# Patient Record
Sex: Male | Born: 1985 | Race: White | Hispanic: No | Marital: Single | Smoking: Former smoker
Health system: Southern US, Community
[De-identification: ages and names within clinical notes are randomized; demographics above are authoritative.]

## PROBLEM LIST (undated history)

## (undated) DIAGNOSIS — K219 Gastro-esophageal reflux disease without esophagitis: Secondary | ICD-10-CM

## (undated) DIAGNOSIS — J309 Allergic rhinitis, unspecified: Secondary | ICD-10-CM

## (undated) HISTORY — DX: Allergic rhinitis, unspecified: J30.9

## (undated) HISTORY — PX: OTHER SURGICAL HISTORY: SHX169

## (undated) HISTORY — PX: WISDOM TOOTH EXTRACTION: SHX21

## (undated) HISTORY — DX: Gastro-esophageal reflux disease without esophagitis: K21.9

---
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
## 2012-06-02 ENCOUNTER — Emergency Department (HOSPITAL_COMMUNITY)
Admission: EM | Admit: 2012-06-02 | Discharge: 2012-06-02 | Disposition: A | Discharge status: Home / Self Care | Payer: BC Managed Care – PPO | Attending: Emergency Medicine | Admitting: Emergency Medicine

## 2012-06-02 ENCOUNTER — Encounter (HOSPITAL_COMMUNITY): Payer: Self-pay | Admitting: Adult Health

## 2012-06-02 DIAGNOSIS — K089 Disorder of teeth and supporting structures, unspecified: Secondary | ICD-10-CM | POA: Insufficient documentation

## 2012-06-02 DIAGNOSIS — K0889 Other specified disorders of teeth and supporting structures: Secondary | ICD-10-CM

## 2012-06-02 DIAGNOSIS — Z791 Long term (current) use of non-steroidal anti-inflammatories (NSAID): Secondary | ICD-10-CM | POA: Insufficient documentation

## 2012-06-02 MED ORDER — CLINDAMYCIN HCL 150 MG PO CAPS: 150.0000 mg | ORAL_CAPSULE | Freq: Four times a day (QID) | ORAL | Status: DC

## 2012-06-02 MED ORDER — HYDROCODONE-ACETAMINOPHEN 5-325 MG PO TABS: 1.0000 | ORAL_TABLET | Freq: Four times a day (QID) | ORAL | Status: DC | PRN

## 2012-06-02 MED ORDER — OXYCODONE-ACETAMINOPHEN 5-325 MG PO TABS
2.0000 | ORAL_TABLET | Freq: Once | ORAL | Status: AC
  Administered 2012-06-02: 2 via ORAL
  Filled 2012-06-02: qty 2

## 2012-06-02 NOTE — ED Notes (Signed)
Presents with upper right molar toothache since 715 this evening. Recently had filling done in November.

## 2012-06-02 NOTE — ED Notes (Signed)
Has taken aleve for pain w/o relief.

## 2012-06-03 NOTE — ED Provider Notes (Signed)
Medical screening examination/treatment/procedure(s) were performed by non-physician practitioner and as supervising physician I was immediately available for consultation/collaboration.  Chrysa Rampy M Breeann Reposa, MD 06/03/12 0642 

## 2012-06-03 NOTE — ED Provider Notes (Signed)
History     CSN: [id number]  Arrival date & time 06/02/12  2054   First MD Initiated Contact with Patient 06/02/12 2148      Chief Complaint  Patient presents with  . Dental Pain    (Consider location/radiation/quality/duration/timing/severity/associated sxs/prior treatment) HPI Comments: This is a 26 year old male, who presents emergency department with chief complaint of right molar pain. Patient states that his pain is 10 out of 10. He has not taken anything to alleviate his symptoms. Pain began this evening at 7:15. The symptoms are worsening. She denies trauma or mechanism of injury. Patient denies headache, blurred vision, new hearing loss, sore throat, chest pain, shortness of breath, nausea, vomiting, diarrhea, constipation, dysuria, peripheral edema, back pain, numbness or tingling of the extremities.   The history is provided by the patient. No language interpreter was used.    History reviewed. No pertinent past medical history.  No past surgical history on file.  No family history on file.  History  Substance Use Topics  . Smoking status: Not on file  . Smokeless tobacco: Not on file  . Alcohol Use: Not on file      Review of Systems  All other systems reviewed and are negative.    Allergies  Amoxicillin and Penicillins  Home Medications   Current Outpatient Rx  Name  Route  Sig  Dispense  Refill  . NAPROXEN SODIUM 220 MG PO TABS   Oral   Take 220 mg by mouth 2 (two) times daily with a meal.         . CLINDAMYCIN HCL 150 MG PO CAPS   Oral   Take 1 capsule (150 mg total) by mouth every 6 (six) hours.   28 capsule   0   . HYDROCODONE-ACETAMINOPHEN 5-325 MG PO TABS   Oral   Take 1 tablet by mouth every 6 (six) hours as needed for pain.   15 tablet   0     BP 142/81  Pulse 91  Temp 98.2 F (36.8 C) (Oral)  Resp 14  SpO2 100%  Physical Exam  Nursing note and vitals reviewed. Constitutional: He is oriented to person, place, and time.  He appears well-developed and well-nourished.  HENT:  Head: Normocephalic and atraumatic.  Mouth/Throat: No oropharyngeal exudate.         Oropharynx is clear, uvula is midline, no tonsillar or peritonsillar abscess, no exudate seen.  Eyes: Conjunctivae normal and EOM are normal.  Neck: Normal range of motion.  Cardiovascular: Normal rate.   Pulmonary/Chest: Effort normal.  Abdominal: He exhibits no distension.  Musculoskeletal: Normal range of motion.  Neurological: He is alert and oriented to person, place, and time.  Skin: Skin is dry.  Psychiatric: He has a normal mood and affect. His behavior is normal. Judgment and thought content normal.    ED Course  Procedures (including critical care time)  Labs Reviewed - No data to display No results found.   1. Pain, dental       MDM  26 year old male with dental pain. I will treat the patient with clindamycin and give him pain medicine. Patient encouraged to followup with his dentist. He understands and agrees with the plan. He is stable and ready for discharge.        Roxy Horseman, PA-C 06/03/12 [phone]

## 2012-12-31 ENCOUNTER — Other Ambulatory Visit: Payer: Self-pay | Admitting: Nurse Practitioner

## 2013-01-02 NOTE — Telephone Encounter (Signed)
Patient last seen by [facility] in office on 03-03-12. Please advise

## 2015-12-26 DIAGNOSIS — L237 Allergic contact dermatitis due to plants, except food: Secondary | ICD-10-CM | POA: Diagnosis not present

## 2016-03-19 DIAGNOSIS — J020 Streptococcal pharyngitis: Secondary | ICD-10-CM | POA: Diagnosis not present

## 2016-03-19 DIAGNOSIS — J029 Acute pharyngitis, unspecified: Secondary | ICD-10-CM | POA: Diagnosis not present

## 2016-07-24 DIAGNOSIS — R52 Pain, unspecified: Secondary | ICD-10-CM | POA: Diagnosis not present

## 2016-07-24 DIAGNOSIS — J111 Influenza due to unidentified influenza virus with other respiratory manifestations: Secondary | ICD-10-CM | POA: Diagnosis not present

## 2017-01-27 ENCOUNTER — Encounter: Payer: Self-pay | Admitting: Family Medicine

## 2017-01-27 ENCOUNTER — Ambulatory Visit (INDEPENDENT_AMBULATORY_CARE_PROVIDER_SITE_OTHER): Payer: BLUE CROSS/BLUE SHIELD | Admitting: Family Medicine

## 2017-01-27 VITALS — BP 148/90 | HR 114 | Temp 97.2°F | Ht 69.0 in | Wt 169.4 lb

## 2017-01-27 DIAGNOSIS — R238 Other skin changes: Secondary | ICD-10-CM | POA: Diagnosis not present

## 2017-01-27 DIAGNOSIS — L989 Disorder of the skin and subcutaneous tissue, unspecified: Secondary | ICD-10-CM | POA: Diagnosis not present

## 2017-01-27 DIAGNOSIS — Z113 Encounter for screening for infections with a predominantly sexual mode of transmission: Secondary | ICD-10-CM | POA: Diagnosis not present

## 2017-01-27 NOTE — Patient Instructions (Addendum)
Great to meet you!  We will call with results or send you a message on Mychart within 1 week

## 2017-01-27 NOTE — Progress Notes (Signed)
   HPI  Patient presents today here to establish care  Bump in the groin Present ffor 2 months, no changing or irritation, has not resolved  Has had 3 partners, all male, in 6 months, does use condoms.  No testicular pain or penile dc  Skin lesion Present "forever" No changes, no irritation or bleeding.    PMH: None Past surgical history: Wisdom tooth extraction Family history MG unit and PGM with Alzheimer's disease Social history: Former smoker, moderate to heavy alcohol use, no drug use  ROS: Per HPI  Objective: BP (!) 148/90   Pulse (!) 114   Temp (!) 97.2 F (36.2 C) (Oral)   Ht 5\' 9"  (1.753 m)   Wt 169 lb 6.4 oz (76.8 kg)   BMI 25.02 kg/m  Gen: NAD, alert, cooperative with exam HEENT: NCAT CV: RRR, good S1/S2, no murmur Resp: CTABL, no wheezes, non-labored Ext: No edema, warm Neuro: Alert and oriented, No gross deficits Skin:  At 11:00 of the penis, approximately 3 cm above he penis he has a very small , approximately 2 mm x 2 mm erythematous slightly raised lesion without any characteristic ( verucous or vesicular) findings  Mid left thoracic back with 5 mm x 4 mm fleshy skin lesion that is flesh-colored with telangiectasia  Assessment and plan:  # Papule, screening for STI Labs for screening for STI's today, the lesion in his groin is not consistent with any STI Reassurance provided Continue to practice safe sex Reassurance, follow-up as needed  # Skin lesion Left upper back skin lesion consistent with skin tag, reassurance provided Follow with time, return to clinic if change in or irritated   Orders Placed This Encounter  Procedures  . HIV antibody  . RPR  . HSV 2 antibody, IgG     Murtis SinkSam Jazzalyn Loewenstein, MD [facility] 01/27/2017, 1:46 PM

## 2017-01-29 LAB — HSV-2 IGG SUPPLEMENTAL TEST: HSV-2 IGG SUPPLEMENTAL TEST: POSITIVE — AB

## 2017-01-29 LAB — HIV ANTIBODY (ROUTINE TESTING W REFLEX): HIV SCREEN 4TH GENERATION: NONREACTIVE

## 2017-01-29 LAB — HSV 2 ANTIBODY, IGG: HSV 2 IGG, TYPE SPEC: 4.51 {index} — AB (ref 0.00–0.90)

## 2017-01-29 LAB — RPR: RPR Ser Ql: NONREACTIVE

## 2017-02-01 ENCOUNTER — Telehealth: Payer: Self-pay | Admitting: Family Medicine

## 2017-02-01 NOTE — Telephone Encounter (Signed)
Called and discussed negative HIV and RPR. HSV2 pending.   Murtis Sink, MD [facility] 02/01/2017, 7:52 AM

## 2017-02-04 ENCOUNTER — Telehealth: Payer: Self-pay | Admitting: Family Medicine

## 2017-02-04 NOTE — Telephone Encounter (Signed)
Called and discussed + HSV 2 titers. His lesion has healed.  The lesion was not c/w HSV, however we discussed cautions around HSV and come in for culture/swab if vesicles come up.   Murtis Sink, MD [facility] 02/04/2017, 7:57 AM

## 2017-03-10 ENCOUNTER — Ambulatory Visit: Payer: BLUE CROSS/BLUE SHIELD | Admitting: Family Medicine

## 2017-03-16 ENCOUNTER — Ambulatory Visit (INDEPENDENT_AMBULATORY_CARE_PROVIDER_SITE_OTHER): Payer: BLUE CROSS/BLUE SHIELD | Admitting: Family Medicine

## 2017-03-16 ENCOUNTER — Encounter: Payer: Self-pay | Admitting: Family Medicine

## 2017-03-16 VITALS — BP 138/88 | HR 104 | Temp 97.3°F | Ht 69.0 in | Wt 167.6 lb

## 2017-03-16 DIAGNOSIS — R300 Dysuria: Secondary | ICD-10-CM | POA: Diagnosis not present

## 2017-03-16 LAB — MICROSCOPIC EXAMINATION: RENAL EPITHEL UA: NONE SEEN /HPF

## 2017-03-16 LAB — URINALYSIS, COMPLETE
Bilirubin, UA: NEGATIVE
GLUCOSE, UA: NEGATIVE
Ketones, UA: NEGATIVE
Nitrite, UA: NEGATIVE
Protein, UA: NEGATIVE
RBC UA: NEGATIVE
Specific Gravity, UA: 1.02 (ref 1.005–1.030)
Urobilinogen, Ur: 0.2 mg/dL (ref 0.2–1.0)
pH, UA: 6 (ref 5.0–7.5)

## 2017-03-16 NOTE — Progress Notes (Signed)
   HPI  Patient presents today here with dysuria.  Patient states that he's had symptoms for about 2 or 3 weeks. He complains of dribbling after urination, urethral irritation after urination, and urinating frequently.  Patient states that he's had sharp increase in his sexual activity, over the last 6 weeks he's had sex one to 2 times daily with a new partner. He began having symptoms about 2 or 3 weeks after he began seeing her.  He states that he did have one episode of malaise and fatigue last week that lasted about 4 hours.  PMH: Smoking status noted ROS: Per HPI  Objective: BP 138/88   Pulse (!) 104   Temp (!) 97.3 F (36.3 C) (Oral)   Ht  (1.753 m)   Wt 167 lb 9.6 oz (76 kg)   BMI 24.75 kg/m  Gen: NAD, alert, cooperative with exam HEENT: NCAT CV: RRR, good S1/S2, no murmur Resp: CTABL, no wheezes, non-labored Abd: SNTND, BS present, no guarding or organomegaly, no CVA tenderness, no suprapubic tenderness Ext: No edema, warm Neuro: Alert and oriented, No gross deficits  Assessment and plan:  # dysuria Reassurance provided, likely this is due to prostatic irritation Discussed prostate exam today, he would like to defer. Urinalysis and GCC collected, discussed further testing if these are positive.     Orders Placed This Encounter  Procedures  . GC/Chlamydia Probe Amp  . Urine Culture  . Urinalysis, Complete    Murtis Sink, MD [facility] 03/16/2017, 5:11 PM

## 2017-03-16 NOTE — Patient Instructions (Signed)
Great to see you!  Call or come back with any concerns  We will let you know about your test results

## 2017-03-17 LAB — URINE CULTURE: Organism ID, Bacteria: NO GROWTH

## 2017-03-19 LAB — GC/CHLAMYDIA PROBE AMP
Chlamydia trachomatis, NAA: POSITIVE — AB
Neisseria gonorrhoeae by PCR: NEGATIVE

## 2017-03-21 ENCOUNTER — Telehealth: Payer: Self-pay | Admitting: Family Medicine

## 2017-03-21 MED ORDER — AZITHROMYCIN 250 MG PO TABS: 1000.0000 mg | ORAL_TABLET | Freq: Once | ORAL | 0 refills | Status: AC

## 2017-03-21 NOTE — Telephone Encounter (Signed)
Spoke with patient and he would like to speak to you directly. Do you mind calling him?

## 2017-03-21 NOTE — Telephone Encounter (Signed)
Left general msg that I will call back.   Murtis Sink, MD [facility] 03/21/2017, 5:06 PM

## 2017-03-21 NOTE — Telephone Encounter (Signed)
Discussed + chlamydia, Tx with azithro 1 gram once.   Have girlfriend treated as well.   Murtis Sink, MD [facility] 03/21/2017, 12:55 PM

## 2017-03-22 NOTE — Telephone Encounter (Signed)
Called and discussed - recommend 7 days without sexual contact for both partners after treatment.   Murtis Sink, MD [facility] 03/22/2017, 8:14 AM

## 2017-04-25 ENCOUNTER — Telehealth: Payer: Self-pay | Admitting: Family Medicine

## 2017-04-25 DIAGNOSIS — R300 Dysuria: Secondary | ICD-10-CM

## 2017-04-25 NOTE — Telephone Encounter (Signed)
Patient states that he is still having frequent urination, trouble preforming and tingling at the head of his penis. Would like to know if another antibiotic can be sent in or if patient needs to come in to be seen. Also wanted to know If he could get one for his gf since they are still sexually active. Advised that she needed to be seen again where she was last seen. Please advise.

## 2017-04-25 NOTE — Telephone Encounter (Signed)
Recommend repeat testing, no need for another ov.   Did he receive rocephin?  Murtis SinkSam Bradshaw, MD [facility] 04/25/2017, 3:02 PM

## 2017-04-25 NOTE — Telephone Encounter (Signed)
Patient aware to come by and leave urine. States he will come by tomorrow. Also states he never got the rocephin injection.

## 2017-04-26 ENCOUNTER — Other Ambulatory Visit: Payer: BLUE CROSS/BLUE SHIELD

## 2017-04-26 DIAGNOSIS — R350 Frequency of micturition: Secondary | ICD-10-CM | POA: Diagnosis not present

## 2017-04-28 LAB — SPECIMEN STATUS REPORT

## 2017-04-28 LAB — GC/CHLAMYDIA PROBE AMP
Chlamydia trachomatis, NAA: NEGATIVE
Neisseria gonorrhoeae by PCR: NEGATIVE

## 2017-04-29 ENCOUNTER — Other Ambulatory Visit: Payer: Self-pay | Admitting: Family Medicine

## 2017-04-29 MED ORDER — AZITHROMYCIN 250 MG PO TABS: ORAL_TABLET | ORAL | 0 refills | Status: DC

## 2017-12-14 ENCOUNTER — Ambulatory Visit: Payer: BLUE CROSS/BLUE SHIELD | Admitting: Physician Assistant

## 2017-12-20 ENCOUNTER — Encounter: Payer: Self-pay | Admitting: Family Medicine

## 2019-08-27 ENCOUNTER — Ambulatory Visit (INDEPENDENT_AMBULATORY_CARE_PROVIDER_SITE_OTHER): Payer: Commercial Managed Care - PPO | Admitting: Family Medicine

## 2019-08-27 ENCOUNTER — Encounter: Payer: Self-pay | Admitting: Family Medicine

## 2019-08-27 DIAGNOSIS — R197 Diarrhea, unspecified: Secondary | ICD-10-CM | POA: Diagnosis not present

## 2019-08-27 DIAGNOSIS — R1084 Generalized abdominal pain: Secondary | ICD-10-CM | POA: Diagnosis not present

## 2019-08-27 DIAGNOSIS — K219 Gastro-esophageal reflux disease without esophagitis: Secondary | ICD-10-CM

## 2019-08-27 DIAGNOSIS — R140 Abdominal distension (gaseous): Secondary | ICD-10-CM | POA: Diagnosis not present

## 2019-08-27 MED ORDER — OMEPRAZOLE 20 MG PO CPDR: 20.0000 mg | DELAYED_RELEASE_CAPSULE | Freq: Every day | ORAL | 3 refills | Status: DC

## 2019-08-27 MED ORDER — DICYCLOMINE HCL 10 MG PO CAPS: 10.0000 mg | ORAL_CAPSULE | Freq: Three times a day (TID) | ORAL | 3 refills | Status: DC

## 2019-08-27 NOTE — Progress Notes (Signed)
Virtual Visit via telephone Note Due to COVID-19 pandemic this visit was conducted virtually. This visit type was conducted due to national recommendations for restrictions regarding the COVID-19 Pandemic (e.g. social distancing, sheltering in place) in an effort to limit this patient's exposure and mitigate transmission in our community. All issues noted in this document were discussed and addressed.  A physical exam was not performed with this format.   I connected with Marc Roy on 08/27/2019 at 1145 by telephone and verified that I am speaking with the correct person using two identifiers. Marc Roy is currently located at home and no one is currently with them during visit. The provider, Kari Baars, FNP is located in their office at time of visit.  I discussed the limitations, risks, security and privacy concerns of performing an evaluation and management service by telephone and the availability of in person appointments. I also discussed with the patient that there may be a patient responsible charge related to this service. The patient expressed understanding and agreed to proceed.  Subjective:  Patient ID: Marc Roy, male    DOB: 21-Mar-1986, 34 y.o.   MRN: [id number]  Chief Complaint:  Abdominal Pain   HPI: Marc Roy is a 34 y.o. male presenting on 08/27/2019 for Abdominal Pain   Abdominal Pain This is a recurrent problem. The current episode started more than 1 month ago. The problem occurs intermittently. The problem has been waxing and waning. The pain is located in the epigastric region, generalized abdominal region and periumbilical region. The pain is at a severity of 4/10. The pain is mild. The quality of the pain is cramping, aching, burning, colicky and a sensation of fullness. The abdominal pain radiates to the epigastric region. Associated symptoms include belching, constipation, diarrhea and flatus. Pertinent negatives include no anorexia,  arthralgias, dysuria, fever, frequency, headaches, hematochezia, hematuria, melena, myalgias, nausea, vomiting or weight loss. The pain is aggravated by certain positions, eating and palpation. The pain is relieved by bowel movements, eating and certain positions. He has tried H2 blockers for the symptoms. The treatment provided no relief.     Relevant past medical, surgical, family, and social history reviewed and updated as indicated.  Allergies and medications reviewed and updated.   History reviewed. No pertinent past medical history.  Past Surgical History:  Procedure Laterality Date  . WISDOM TOOTH EXTRACTION      Social History   Socioeconomic History  . Marital status: Single    Spouse name: Not on file  . Number of children: Not on file  . Years of education: Not on file  . Highest education level: Not on file  Occupational History  . Not on file  Tobacco Use  . Smoking status: Former Smoker    Types: Cigarettes    Quit date: 2012    Years since quitting: 9.2  . Smokeless tobacco: Never Used  Substance and Sexual Activity  . Alcohol use: Yes    Alcohol/week: 21.0 standard drinks    Types: 21 Cans of beer per week  . Drug use: No  . Sexual activity: Not on file  Other Topics Concern  . Not on file  Social History Narrative  . Not on file   Social Determinants of Health   Financial Resource Strain:   . Difficulty of Paying Living Expenses:   Food Insecurity:   . Worried About Programme researcher, broadcasting/film/video in the Last Year:   . [employer] of Food in the  Last Year:   Transportation Needs:   . Film/video editor (Medical):   Marland Kitchen Lack of Transportation (Non-Medical):   Physical Activity:   . Days of Exercise per Week:   . Minutes of Exercise per Session:   Stress:   . Feeling of Stress :   Social Connections:   . Frequency of Communication with Friends and Family:   . Frequency of Social Gatherings with Friends and Family:   . Attends Religious Services:   .  Active Member of Clubs or Organizations:   . Attends Archivist Meetings:   Marland Kitchen Marital Status:   Intimate Partner Violence:   . Fear of Current or Ex-Partner:   . Emotionally Abused:   Marland Kitchen Physically Abused:   . Sexually Abused:     Outpatient Encounter Medications as of 08/27/2019  Medication Sig  . dicyclomine (BENTYL) 10 MG capsule Take 1 capsule (10 mg total) by mouth 4 (four) times daily -  before meals and at bedtime.  Marland Kitchen omeprazole (PRILOSEC) 20 MG capsule Take 1 capsule (20 mg total) by mouth daily.  . [DISCONTINUED] azithromycin (ZITHROMAX) 250 MG tablet 4 tabs once   No facility-administered encounter medications on file as of 08/27/2019.    Allergies  Allergen Reactions  . Amoxicillin   . Penicillins     Review of Systems  Constitutional: Negative for activity change, appetite change, chills, diaphoresis, fatigue, fever, unexpected weight change and weight loss.  HENT: Negative.  Negative for sore throat, trouble swallowing and voice change.   Eyes: Negative.  Negative for photophobia and visual disturbance.  Respiratory: Negative for cough, choking, chest tightness and shortness of breath.   Cardiovascular: Negative for chest pain, palpitations and leg swelling.  Gastrointestinal: Positive for abdominal pain, constipation, diarrhea and flatus. Negative for abdominal distention, anal bleeding, anorexia, blood in stool, hematochezia, melena, nausea, rectal pain and vomiting.  Endocrine: Negative.   Genitourinary: Negative for difficulty urinating, dysuria, frequency, hematuria and urgency.  Musculoskeletal: Negative for arthralgias, back pain and myalgias.  Skin: Negative.   Allergic/Immunologic: Negative.   Neurological: Negative for dizziness, tremors, seizures, syncope, facial asymmetry, speech difficulty, weakness, light-headedness, numbness and headaches.  Hematological: Negative.   Psychiatric/Behavioral: Negative for confusion, hallucinations, sleep  disturbance and suicidal ideas.  All other systems reviewed and are negative.        Observations/Objective: No vital signs or physical exam, this was a telephone or virtual health encounter.  Pt alert and oriented, answers all questions appropriately, and able to speak in full sentences.    Assessment and Plan: Palmer was seen today for abdominal pain.  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 Will trial below with dietary changes to see if beneficial. Pt aware to report any new, worsening, or persistent symptoms. Follow up in 2-4 weeks or sooner if needed.  -     dicyclomine (BENTYL) 10 MG capsule; Take 1 capsule (10 mg total) by mouth 4 (four) times daily -  before meals and at bedtime.  Gastroesophageal reflux disease without esophagitis No red flags present. Diet discussed. Avoid fried, spicy, fatty, greasy, and acidic foods. Avoid caffeine, nicotine, and alcohol. Do not eat 2-3 hours before bedtime and stay upright for at least 1-2 hours after eating. Eat small frequent meals. Avoid NSAID's like motrin and aleve. Medications as prescribed. Report any new or worsening symptoms. Follow up as discussed or sooner if needed.   -  omeprazole (PRILOSEC) 20 MG capsule; Take 1 capsule (20 mg total) by mouth daily.     Follow Up Instructions: Return in about 2 weeks (around 09/10/2019), or if symptoms worsen or fail to improve, for abdominal pain.    I discussed the assessment and treatment plan with the patient. The patient was provided an opportunity to ask questions and all were answered. The patient agreed with the plan and demonstrated an understanding of the instructions.   The patient was advised to call back or seek an in-person evaluation if the symptoms worsen or if the condition fails to improve as anticipated.  The above assessment and management plan  was discussed with the patient. The patient verbalized understanding of and has agreed to the management plan. Patient is aware to call the clinic if they develop any new symptoms or if symptoms persist or worsen. Patient is aware when to return to the clinic for a follow-up visit. Patient educated on when it is appropriate to go to the emergency department.    I provided 25 minutes of non-face-to-face time during this encounter. The call started at 1145. The call ended at 1210. The other time was used for coordination of care.    Kari Baars, FNP-C [facility] [address] [place], [place] [address] [phone] 08/27/2019

## 2019-09-03 ENCOUNTER — Telehealth: Payer: Self-pay | Admitting: Family Medicine

## 2019-09-03 NOTE — Telephone Encounter (Signed)
If he continues to have symptoms, he needs to be evaluated in person. It usually takes 8 weeks of therapy for the medications to be fully effective.

## 2019-09-03 NOTE — Telephone Encounter (Signed)
Pt aware of provider feedback and also wanted to scheduled CPE which I scheduled for 3/31 at 2:20.

## 2019-09-06 ENCOUNTER — Telehealth (INDEPENDENT_AMBULATORY_CARE_PROVIDER_SITE_OTHER): Payer: Commercial Managed Care - PPO | Admitting: Family Medicine

## 2019-09-06 ENCOUNTER — Encounter: Payer: Self-pay | Admitting: Family Medicine

## 2019-09-06 DIAGNOSIS — R1032 Left lower quadrant pain: Secondary | ICD-10-CM

## 2019-09-06 DIAGNOSIS — R109 Unspecified abdominal pain: Secondary | ICD-10-CM

## 2019-09-06 DIAGNOSIS — R300 Dysuria: Secondary | ICD-10-CM | POA: Diagnosis not present

## 2019-09-06 LAB — URINALYSIS
Bilirubin, UA: NEGATIVE
Glucose, UA: NEGATIVE
Ketones, UA: NEGATIVE
Leukocytes,UA: NEGATIVE
Nitrite, UA: NEGATIVE
Protein,UA: NEGATIVE
RBC, UA: NEGATIVE
Specific Gravity, UA: 1.02 (ref 1.005–1.030)
Urobilinogen, Ur: 1 mg/dL (ref 0.2–1.0)
pH, UA: 7.5 (ref 5.0–7.5)

## 2019-09-06 NOTE — Progress Notes (Signed)
Virtual Visit via MyChart Video Note Due to COVID-19 pandemic this visit was conducted virtually. This visit type was conducted due to national recommendations for restrictions regarding the COVID-19 Pandemic (e.g. social distancing, sheltering in place) in an effort to limit this patient's exposure and mitigate transmission in our community. All issues noted in this document were discussed and addressed.  A physical exam was not performed with this format.   I connected with Marc Roy on 09/06/2019 at 51 by MyChart Video and verified that I am speaking with the correct person using two identifiers. Marc Roy is currently located at home and no one is currently with them during visit. The provider, Monia Pouch, FNP is located in their office at time of visit.  I discussed the limitations, risks, security and privacy concerns of performing an evaluation and management service by video / telephone and the availability of in person appointments. I also discussed with the patient that there may be a patient responsible charge related to this service. The patient expressed understanding and agreed to proceed.  Subjective:  Patient ID: Marc Roy, male    DOB: [birth year], 34 y.o.   MRN: [id number]  Chief Complaint:  Abdominal Pain, Flank Pain, and Dysuria   HPI: Marc Roy is a 34 y.o. male presenting on 09/06/2019 for Abdominal Pain, Flank Pain, and Dysuria   Abdominal Pain This is a recurrent problem. The current episode started more than 1 month ago. The problem occurs intermittently. The problem has been gradually worsening. The pain is located in the LLQ, periumbilical region, suprapubic region and left flank. The pain is at a severity of 6/10. The pain is moderate. The quality of the pain is sharp, cramping, colicky, aching and dull. The abdominal pain radiates to the scrotum, left flank and suprapubic region. Associated symptoms include constipation, diarrhea,  dysuria and nausea. Pertinent negatives include no anorexia, arthralgias, belching, fever, flatus, frequency, headaches, hematochezia, hematuria, melena, myalgias, vomiting or weight loss. The pain is aggravated by bowel movement, certain positions, palpation and urination. The pain is relieved by nothing. He has tried proton pump inhibitors (Bentyl) for the symptoms. The treatment provided no relief.  Flank Pain This is a recurrent problem. The current episode started 1 to 4 weeks ago. The problem occurs intermittently. The problem has been waxing and waning since onset. Pain location: left flank. The quality of the pain is described as aching and shooting. The pain does not radiate. The pain is at a severity of 6/10. The pain is moderate. Associated symptoms include abdominal pain and dysuria. Pertinent negatives include no bladder incontinence, bowel incontinence, chest pain, fever, headaches, leg pain, numbness, paresis, paresthesias, pelvic pain, perianal numbness, tingling, weakness or weight loss.  Dysuria  This is a new problem. The current episode started 1 to 4 weeks ago. The problem occurs every urination. The problem has been waxing and waning. The quality of the pain is described as aching and burning. The pain is at a severity of 4/10. The pain is mild. There has been no fever. He is sexually active. There is no history of pyelonephritis. Associated symptoms include flank pain and nausea. Pertinent negatives include no chills, discharge, frequency, hematuria, hesitancy, possible pregnancy, sweats, urgency or vomiting. He has tried nothing for the symptoms.     Relevant past medical, surgical, family, and social history reviewed and updated as indicated.  Allergies and medications reviewed and updated.   History reviewed. No pertinent past medical history.  Past  Surgical History:  Procedure Laterality Date  . WISDOM TOOTH EXTRACTION      Social History   Socioeconomic History  .  Marital status: Single    Spouse name: Not on file  . Number of children: Not on file  . Years of education: Not on file  . Highest education level: Not on file  Occupational History  . Not on file  Tobacco Use  . Smoking status: Former Smoker    Types: Cigarettes    Quit date: 2012    Years since quitting: 9.2  . Smokeless tobacco: Never Used  Substance and Sexual Activity  . Alcohol use: Yes    Alcohol/week: 21.0 standard drinks    Types: 21 Cans of beer per week  . Drug use: No  . Sexual activity: Not on file  Other Topics Concern  . Not on file  Social History Narrative  . Not on file   Social Determinants of Health   Financial Resource Strain:   . Difficulty of Paying Living Expenses:   Food Insecurity:   . Worried About Charity fundraiser in the Last Year:   . Arboriculturist in the Last Year:   Transportation Needs:   . Film/video editor (Medical):   Marland Kitchen Lack of Transportation (Non-Medical):   Physical Activity:   . Days of Exercise per Week:   . Minutes of Exercise per Session:   Stress:   . Feeling of Stress :   Social Connections:   . Frequency of Communication with Friends and Family:   . Frequency of Social Gatherings with Friends and Family:   . Attends Religious Services:   . Active Member of Clubs or Organizations:   . Attends Archivist Meetings:   Marland Kitchen Marital Status:   Intimate Partner Violence:   . Fear of Current or Ex-Partner:   . Emotionally Abused:   Marland Kitchen Physically Abused:   . Sexually Abused:     Outpatient Encounter Medications as of 09/06/2019  Medication Sig  . dicyclomine (BENTYL) 10 MG capsule Take 1 capsule (10 mg total) by mouth 4 (four) times daily -  before meals and at bedtime.  Marland Kitchen omeprazole (PRILOSEC) 20 MG capsule Take 1 capsule (20 mg total) by mouth daily.   No facility-administered encounter medications on file as of 09/06/2019.    Allergies  Allergen Reactions  . Amoxicillin   . Penicillins     Review of  Systems  Constitutional: Negative for activity change, appetite change, chills, diaphoresis, fatigue, fever, unexpected weight change and weight loss.  HENT: Negative.   Eyes: Negative.   Respiratory: Negative for cough, chest tightness and shortness of breath.   Cardiovascular: Negative for chest pain, palpitations and leg swelling.  Gastrointestinal: Positive for abdominal pain, constipation, diarrhea and nausea. Negative for abdominal distention, anal bleeding, anorexia, blood in stool, bowel incontinence, flatus, hematochezia, melena, rectal pain and vomiting.  Endocrine: Negative.  Negative for cold intolerance, heat intolerance, polydipsia, polyphagia and polyuria.  Genitourinary: Positive for dysuria, flank pain and testicular pain (feeling of discomfort. No swelling, erythema, or masses). Negative for bladder incontinence, decreased urine volume, difficulty urinating, discharge, enuresis, frequency, genital sores, hematuria, hesitancy, pelvic pain, penile pain, penile swelling, scrotal swelling and urgency.  Musculoskeletal: Negative for arthralgias and myalgias.  Skin: Negative.   Allergic/Immunologic: Negative.   Neurological: Negative for dizziness, tingling, tremors, seizures, syncope, facial asymmetry, speech difficulty, weakness, light-headedness, numbness, headaches and paresthesias.  Hematological: Negative.   Psychiatric/Behavioral: Negative for confusion,  hallucinations, sleep disturbance and suicidal ideas.  All other systems reviewed and are negative.        Observations/Objective: No vital signs or physical exam, this was a telephone or virtual health encounter.  Pt alert and oriented, answers all questions appropriately, and able to speak in full sentences.    Assessment and Plan: Jerimie was seen today for abdominal pain, flank pain and dysuria.  Diagnoses and all orders for this visit:  Left lower quadrant abdominal pain Flank pain Ongoing and worsening  symptoms. Will obtain labs and CT of abdomen to rule out infectious process, hernia, or renal stone. Pt aware to have blood drawn, provide urine, and have imaging completed. Will determine treatment once tests result. Pt aware of symptoms that require emergent evaluation.  -     CT Abdomen Pelvis W Contrast; Future -     Urine Culture -     CMP14+EGFR -     CBC with Differential/Platelet -     Urinalysis  Dysuria As above. Will also obtain STI testing. Treatment pending results.  -     CT Abdomen Pelvis W Contrast; Future -     Chlamydia/Gonococcus/Trichomonas, NAA -     Urine Culture -     CMP14+EGFR -     CBC with Differential/Platelet -     Urinalysis     Follow Up Instructions: Return if symptoms worsen or fail to improve.    I discussed the assessment and treatment plan with the patient. The patient was provided an opportunity to ask questions and all were answered. The patient agreed with the plan and demonstrated an understanding of the instructions.   The patient was advised to call back or seek an in-person evaluation if the symptoms worsen or if the condition fails to improve as anticipated.  The above assessment and management plan was discussed with the patient. The patient verbalized understanding of and has agreed to the management plan. Patient is aware to call the clinic if they develop any new symptoms or if symptoms persist or worsen. Patient is aware when to return to the clinic for a follow-up visit. Patient educated on when it is appropriate to go to the emergency department.    I provided 20 minutes of non-face-to-face time during this encounter. The video started at 1115. The video ended at 1135. The other time was used for coordination of care.    Monia Pouch, FNP-C [facility] [address] [place], [place] [address] [phone] 09/06/2019

## 2019-09-07 LAB — CMP14+EGFR
ALT: 32 IU/L (ref 0–44)
AST: 34 IU/L (ref 0–40)
Albumin/Globulin Ratio: 2.1 (ref 1.2–2.2)
Albumin: 4.9 g/dL (ref 4.0–5.0)
Alkaline Phosphatase: 50 IU/L (ref 39–117)
BUN/Creatinine Ratio: 21 — ABNORMAL HIGH (ref 9–20)
BUN: 19 mg/dL (ref 6–20)
Bilirubin Total: 0.5 mg/dL (ref 0.0–1.2)
CO2: 26 mmol/L (ref 20–29)
Calcium: 9.4 mg/dL (ref 8.7–10.2)
Chloride: 103 mmol/L (ref 96–106)
Creatinine, Ser: 0.91 mg/dL (ref 0.76–1.27)
GFR calc Af Amer: 129 mL/min/{1.73_m2} (ref >59)
GFR calc non Af Amer: 112 mL/min/{1.73_m2} (ref >59)
Globulin, Total: 2.3 g/dL (ref 1.5–4.5)
Glucose: 92 mg/dL (ref 65–99)
Potassium: 3.9 mmol/L (ref 3.5–5.2)
Sodium: 143 mmol/L (ref 134–144)
Total Protein: 7.2 g/dL (ref 6.0–8.5)

## 2019-09-07 LAB — CBC WITH DIFFERENTIAL/PLATELET
Basophils Absolute: 0 10*3/uL (ref 0.0–0.2)
Basos: 1 %
EOS (ABSOLUTE): 0.2 10*3/uL (ref 0.0–0.4)
Eos: 3 %
Hematocrit: 47.1 % (ref 37.5–51.0)
Hemoglobin: 16.1 g/dL (ref 13.0–17.7)
Immature Grans (Abs): 0 10*3/uL (ref 0.0–0.1)
Immature Granulocytes: 0 %
Lymphocytes Absolute: 2.5 10*3/uL (ref 0.7–3.1)
Lymphs: 36 %
MCH: 31 pg (ref 26.6–33.0)
MCHC: 34.2 g/dL (ref 31.5–35.7)
MCV: 91 fL (ref 79–97)
Monocytes Absolute: 0.5 10*3/uL (ref 0.1–0.9)
Monocytes: 7 %
Neutrophils Absolute: 3.7 10*3/uL (ref 1.4–7.0)
Neutrophils: 53 %
Platelets: 254 10*3/uL (ref 150–450)
RBC: 5.19 x10E6/uL (ref 4.14–5.80)
RDW: 12 % (ref 11.6–15.4)
WBC: 6.8 10*3/uL (ref 3.4–10.8)

## 2019-09-07 LAB — URINE CULTURE: Organism ID, Bacteria: NO GROWTH

## 2019-09-09 LAB — CHLAMYDIA/GONOCOCCUS/TRICHOMONAS, NAA
Chlamydia by NAA: NEGATIVE
Gonococcus by NAA: NEGATIVE
Trich vag by NAA: NEGATIVE

## 2019-09-10 ENCOUNTER — Telehealth: Payer: Self-pay | Admitting: Family Medicine

## 2019-09-12 ENCOUNTER — Encounter: Payer: Self-pay | Admitting: Family Medicine

## 2019-09-12 ENCOUNTER — Ambulatory Visit (INDEPENDENT_AMBULATORY_CARE_PROVIDER_SITE_OTHER): Payer: Commercial Managed Care - PPO | Admitting: Family Medicine

## 2019-09-12 ENCOUNTER — Other Ambulatory Visit: Payer: Self-pay

## 2019-09-12 VITALS — BP 128/74 | HR 96 | Temp 99.6°F | Ht 69.0 in | Wt 154.2 lb

## 2019-09-12 DIAGNOSIS — Z13220 Encounter for screening for lipoid disorders: Secondary | ICD-10-CM

## 2019-09-12 DIAGNOSIS — Z0001 Encounter for general adult medical examination with abnormal findings: Secondary | ICD-10-CM | POA: Diagnosis not present

## 2019-09-12 DIAGNOSIS — H6123 Impacted cerumen, bilateral: Secondary | ICD-10-CM | POA: Diagnosis not present

## 2019-09-12 DIAGNOSIS — R5383 Other fatigue: Secondary | ICD-10-CM | POA: Diagnosis not present

## 2019-09-12 DIAGNOSIS — K219 Gastro-esophageal reflux disease without esophagitis: Secondary | ICD-10-CM | POA: Insufficient documentation

## 2019-09-12 DIAGNOSIS — R1032 Left lower quadrant pain: Secondary | ICD-10-CM | POA: Diagnosis not present

## 2019-09-12 DIAGNOSIS — Z23 Encounter for immunization: Secondary | ICD-10-CM | POA: Diagnosis not present

## 2019-09-12 DIAGNOSIS — Z0000 Encounter for general adult medical examination without abnormal findings: Secondary | ICD-10-CM

## 2019-09-12 LAB — URINALYSIS, COMPLETE
Bilirubin, UA: NEGATIVE
Glucose, UA: NEGATIVE
Ketones, UA: NEGATIVE
Leukocytes,UA: NEGATIVE
Nitrite, UA: NEGATIVE
Protein,UA: NEGATIVE
RBC, UA: NEGATIVE
Specific Gravity, UA: >1.03 — ABNORMAL HIGH (ref 1.005–1.030)
Urobilinogen, Ur: 0.2 mg/dL (ref 0.2–1.0)
pH, UA: 5.5 (ref 5.0–7.5)

## 2019-09-12 LAB — MICROSCOPIC EXAMINATION
Bacteria, UA: NONE SEEN
Epithelial Cells (non renal): NONE SEEN /hpf (ref 0–10)
RBC, Urine: NONE SEEN /hpf (ref 0–2)
Renal Epithel, UA: NONE SEEN /hpf
WBC, UA: NONE SEEN /hpf (ref 0–5)

## 2019-09-12 NOTE — Addendum Note (Signed)
Addended by: Adella Hare B on: 09/12/2019 03:02 PM   Modules accepted: Orders

## 2019-09-12 NOTE — Patient Instructions (Addendum)
Marc Roy on 09/21/2019 for CT abdomen. Arrive at least 30 minutes prior.   Go by Marc Roy 1-2 days prior to imaging to pick up prep.    Health Maintenance, Male Adopting a healthy lifestyle and getting preventive care are important in promoting health and wellness. Ask your health care provider about:  The right schedule for you to have regular tests and exams.  Things you can do on your own to prevent diseases and keep yourself healthy. What should I know about diet, weight, and exercise? Eat a healthy diet   Eat a diet that includes plenty of vegetables, fruits, low-fat dairy products, and lean protein.  Do not eat a lot of foods that are high in solid fats, added sugars, or sodium. Maintain a healthy weight Body mass index (BMI) is a measurement that can be used to identify possible weight problems. It estimates body fat based on height and weight. Your health care provider can help determine your BMI and help you achieve or maintain a healthy weight. Get regular exercise Get regular exercise. This is one of the most important things you can do for your health. Most adults should:  Exercise for at least 150 minutes each week. The exercise should increase your heart rate and make you sweat (moderate-intensity exercise).  Do strengthening exercises at least twice a week. This is in addition to the moderate-intensity exercise.  Spend less time sitting. Even light physical activity can be beneficial. Watch cholesterol and blood lipids Have your blood tested for lipids and cholesterol at 32 years of age, then have this test every 5 years. You may need to have your cholesterol levels checked more often if:  Your lipid or cholesterol levels are high.  You are older than 32 years of age.  You are at high risk for heart disease. What should I know about cancer screening? Many types of cancers can be detected early and may often be prevented. Depending on your health history and  family history, you may need to have cancer screening at various ages. This may include screening for:  Colorectal cancer.  Prostate cancer.  Skin cancer.  Lung cancer. What should I know about heart disease, diabetes, and high blood pressure? Blood pressure and heart disease  High blood pressure causes heart disease and increases the risk of stroke. This is more likely to develop in people who have high blood pressure readings, are of African descent, or are overweight.  Talk with your health care provider about your target blood pressure readings.  Have your blood pressure checked: ? Every 3-5 years if you are 6-45 years of age. ? Every year if you are 10 years old or older.  If you are between the ages of 43 and 27 and are a current or former smoker, ask your health care provider if you should have a one-time screening for abdominal aortic aneurysm (AAA). Diabetes Have regular diabetes screenings. This checks your fasting blood sugar level. Have the screening done:  Once every three years after age 70 if you are at a normal weight and have a low risk for diabetes.  More often and at a younger age if you are overweight or have a high risk for diabetes. What should I know about preventing infection? Hepatitis B If you have a higher risk for hepatitis B, you should be screened for this virus. Talk with your health care provider to find out if you are at risk for hepatitis B infection. Hepatitis C Blood  testing is recommended for:  Everyone born from [birth year] through 1963.  Anyone with known risk factors for hepatitis C. Sexually transmitted infections (STIs)  You should be screened each year for STIs, including gonorrhea and chlamydia, if: ? You are sexually active and are younger than 32 years of age. ? You are older than 32 years of age and your health care provider tells you that you are at risk for this type of infection. ? Your sexual activity has changed since you were  last screened, and you are at increased risk for chlamydia or gonorrhea. Ask your health care provider if you are at risk.  Ask your health care provider about whether you are at high risk for HIV. Your health care provider may recommend a prescription medicine to help prevent HIV infection. If you choose to take medicine to prevent HIV, you should first get tested for HIV. You should then be tested every 3 months for as long as you are taking the medicine. Follow these instructions at home: Lifestyle  Do not use any products that contain nicotine or tobacco, such as cigarettes, e-cigarettes, and chewing tobacco. If you need help quitting, ask your health care provider.  Do not use street drugs.  Do not share needles.  Ask your health care provider for help if you need support or information about quitting drugs. Alcohol use  Do not drink alcohol if your health care provider tells you not to drink.  If you drink alcohol: ? Limit how much you have to 0-2 drinks a day. ? Be aware of how much alcohol is in your drink. In the [place] one drink equals one 12 oz bottle of beer (355 mL), one 5 oz glass of wine (148 mL), or one 1 oz glass of hard liquor (44 mL). General instructions  Schedule regular health, dental, and eye exams.  Stay current with your vaccines.  Tell your health care provider if: ? You often feel depressed. ? You have ever been abused or do not feel safe at home. Summary  Adopting a healthy lifestyle and getting preventive care are important in promoting health and wellness.  Follow your health care provider's instructions about healthy diet, exercising, and getting tested or screened for diseases.  Follow your health care provider's instructions on monitoring your cholesterol and blood pressure. This information is not intended to replace advice given to you by your health care provider. Make sure you discuss any questions you have with your health care  provider. Document Revised: 05/24/2018 Document Reviewed: 05/24/2018 Elsevier Patient Education  2020 [employer].

## 2019-09-12 NOTE — Progress Notes (Signed)
Subjective:  Patient ID: Marc Roy, male    DOB: 1985-09-14, 34 y.o.   MRN: [id number]  Patient Care Team: Baruch Gouty, FNP as PCP - General (Family Medicine)   Chief Complaint:  Annual Exam   HPI: Marc Roy is a 34 y.o. male presenting on 09/12/2019 for Annual Exam   Pt presents today for his annual physical exam. States he is doing ok overall. States he does have some continued intermittent epigastric and LLQ abdominal pain. States he has not had the scheduled CT, it  Will be on 09/21/2019. Pt states the Bentyl and Omeprazole are helping some. No fever, chills, weight changes, or night sweats. No melena or hematochezia. Pt states his symptoms are worse after eating vegetables. Pt states he does feel tired. States this has worsened over the last several weeks. Does try to sleep for at least 6-7 hours. Does have a family history of thyroid disease.    Relevant past medical, surgical, family, and social history reviewed and updated as indicated.  Allergies and medications reviewed and updated. Date reviewed: Chart in Epic.   History reviewed. No pertinent past medical history.  Past Surgical History:  Procedure Laterality Date  . WISDOM TOOTH EXTRACTION      Social History   Socioeconomic History  . Marital status: Single    Spouse name: Not on file  . Number of children: Not on file  . Years of education: Not on file  . Highest education level: Not on file  Occupational History  . Not on file  Tobacco Use  . Smoking status: Former Smoker    Types: Cigarettes    Quit date: 2012    Years since quitting: 9.2  . Smokeless tobacco: Never Used  Substance and Sexual Activity  . Alcohol use: Yes    Alcohol/week: 21.0 standard drinks    Types: 21 Cans of beer per week  . Drug use: No  . Sexual activity: Not on file  Other Topics Concern  . Not on file  Social History Narrative  . Not on file   Social Determinants of Health   Financial Resource  Strain:   . Difficulty of Paying Living Expenses:   Food Insecurity:   . Worried About Charity fundraiser in the Last Year:   . Arboriculturist in the Last Year:   Transportation Needs:   . Film/video editor (Medical):   Marland Kitchen Lack of Transportation (Non-Medical):   Physical Activity:   . Days of Exercise per Week:   . Minutes of Exercise per Session:   Stress:   . Feeling of Stress :   Social Connections:   . Frequency of Communication with Friends and Family:   . Frequency of Social Gatherings with Friends and Family:   . Attends Religious Services:   . Active Member of Clubs or Organizations:   . Attends Archivist Meetings:   Marland Kitchen Marital Status:   Intimate Partner Violence:   . Fear of Current or Ex-Partner:   . Emotionally Abused:   Marland Kitchen Physically Abused:   . Sexually Abused:     Outpatient Encounter Medications as of 09/12/2019  Medication Sig  . dicyclomine (BENTYL) 10 MG capsule Take 1 capsule (10 mg total) by mouth 4 (four) times daily -  before meals and at bedtime.  Marland Kitchen omeprazole (PRILOSEC) 20 MG capsule Take 1 capsule (20 mg total) by mouth daily.   No facility-administered encounter medications on file  as of 09/12/2019.    Allergies  Allergen Reactions  . Amoxicillin   . Penicillins     Review of Systems  Constitutional: Positive for fatigue. Negative for activity change, appetite change, chills, diaphoresis, fever and unexpected weight change.  HENT: Negative.   Eyes: Negative.  Negative for photophobia and visual disturbance.  Respiratory: Negative for cough, chest tightness and shortness of breath.   Cardiovascular: Negative for chest pain, palpitations and leg swelling.  Gastrointestinal: Positive for abdominal pain. Negative for abdominal distention, anal bleeding, blood in stool, constipation, diarrhea, nausea, rectal pain and vomiting.  Endocrine: Negative.  Negative for cold intolerance, heat intolerance, polydipsia, polyphagia and polyuria.    Genitourinary: Negative for decreased urine volume, difficulty urinating, dysuria, frequency, hematuria and urgency.  Musculoskeletal: Negative for arthralgias and myalgias.  Skin: Negative.   Allergic/Immunologic: Negative.   Neurological: Negative for dizziness, tremors, seizures, syncope, facial asymmetry, speech difficulty, weakness, light-headedness, numbness and headaches.  Hematological: Negative.   Psychiatric/Behavioral: Negative for confusion, hallucinations, sleep disturbance and suicidal ideas.  All other systems reviewed and are negative.       Objective:  BP 128/74   Pulse 96   Temp 99.6 F (37.6 C)   Ht '5\' 9"'  (1.753 m)   Wt 154 lb 3.2 oz (69.9 kg)   SpO2 100%   BMI 22.77 kg/m    Wt Readings from Last 3 Encounters:  09/12/19 154 lb 3.2 oz (69.9 kg)  03/16/17 167 lb 9.6 oz (76 kg)  01/27/17 169 lb 6.4 oz (76.8 kg)    Physical Exam Vitals and nursing note reviewed.  Constitutional:      General: He is not in acute distress.    Appearance: Normal appearance. He is well-developed, well-groomed and normal weight. He is not ill-appearing, toxic-appearing or diaphoretic.  HENT:     Head: Normocephalic and atraumatic.     Jaw: There is normal jaw occlusion.     Right Ear: Hearing normal. There is impacted cerumen.     Left Ear: Hearing normal. There is impacted cerumen.     Nose: Nose normal.     Mouth/Throat:     Lips: Pink.     Mouth: Mucous membranes are moist.     Pharynx: Oropharynx is clear. Uvula midline.  Eyes:     General: Lids are normal.     Extraocular Movements: Extraocular movements intact.     Conjunctiva/sclera: Conjunctivae normal.     Pupils: Pupils are equal, round, and reactive to light.  Neck:     Thyroid: No thyroid mass, thyromegaly or thyroid tenderness.     Vascular: No carotid bruit or JVD.     Trachea: Trachea and phonation normal.  Cardiovascular:     Rate and Rhythm: Normal rate and regular rhythm.     Chest Wall: PMI is not  displaced.     Pulses: Normal pulses.     Heart sounds: Normal heart sounds. No murmur. No friction rub. No gallop.   Pulmonary:     Effort: Pulmonary effort is normal. No respiratory distress.     Breath sounds: Normal breath sounds. No wheezing.  Abdominal:     General: Abdomen is flat. Bowel sounds are normal. There is no distension or abdominal bruit.     Palpations: Abdomen is soft. There is no hepatomegaly, splenomegaly or mass.     Tenderness: There is no abdominal tenderness. There is no right CVA tenderness, left CVA tenderness, guarding or rebound. Negative signs include Murphy's sign and McBurney's  sign.     Hernia: No hernia is present.  Musculoskeletal:        General: Normal range of motion.     Cervical back: Normal range of motion and neck supple.     Right lower leg: No edema.     Left lower leg: No edema.  Lymphadenopathy:     Cervical: No cervical adenopathy.     Right cervical: No superficial, deep or posterior cervical adenopathy.    Left cervical: No superficial, deep or posterior cervical adenopathy.  Skin:    General: Skin is warm and dry.     Capillary Refill: Capillary refill takes less than 2 seconds.     Coloration: Skin is not cyanotic, jaundiced or pale.     Findings: No rash.  Neurological:     General: No focal deficit present.     Mental Status: He is alert and oriented to person, place, and time.     Cranial Nerves: Cranial nerves are intact. No cranial nerve deficit.     Sensory: Sensation is intact. No sensory deficit.     Motor: Motor function is intact. No weakness.     Coordination: Coordination is intact. Coordination normal.     Gait: Gait is intact. Gait normal.     Deep Tendon Reflexes: Reflexes are normal and symmetric. Reflexes normal.  Psychiatric:        Attention and Perception: Attention and perception normal.        Mood and Affect: Affect normal. Mood is anxious.        Speech: Speech normal.        Behavior: Behavior normal.  Behavior is cooperative.        Thought Content: Thought content normal.        Cognition and Memory: Cognition and memory normal.        Judgment: Judgment normal.     Results for orders placed or performed in visit on 09/06/19  Chlamydia/Gonococcus/Trichomonas, NAA   Specimen: Urine   UR  Result Value Ref Range   Chlamydia by NAA Negative Negative   Gonococcus by NAA Negative Negative   Trich vag by NAA Negative Negative  Urine Culture   Specimen: Urine   UR  Result Value Ref Range   Urine Culture, Routine Final report    Organism ID, Bacteria No growth   CMP14+EGFR  Result Value Ref Range   Glucose 92 65 - 99 mg/dL   BUN 19 6 - 20 mg/dL   Creatinine, Ser 0.91 0.76 - 1.27 mg/dL   GFR calc non Af Amer 112 >59 mL/min/1.73   GFR calc Af Amer 129 >59 mL/min/1.73   BUN/Creatinine Ratio 21 (H) 9 - 20   Sodium 143 134 - 144 mmol/L   Potassium 3.9 3.5 - 5.2 mmol/L   Chloride 103 96 - 106 mmol/L   CO2 26 20 - 29 mmol/L   Calcium 9.4 8.7 - 10.2 mg/dL   Total Protein 7.2 6.0 - 8.5 g/dL   Albumin 4.9 4.0 - 5.0 g/dL   Globulin, Total 2.3 1.5 - 4.5 g/dL   Albumin/Globulin Ratio 2.1 1.2 - 2.2   Bilirubin Total 0.5 0.0 - 1.2 mg/dL   Alkaline Phosphatase 50 39 - 117 IU/L   AST 34 0 - 40 IU/L   ALT 32 0 - 44 IU/L  CBC with Differential/Platelet  Result Value Ref Range   WBC 6.8 3.4 - 10.8 x10E3/uL   RBC 5.19 4.14 - 5.80 x10E6/uL   Hemoglobin 16.1  13.0 - 17.7 g/dL   Hematocrit 47.1 37.5 - 51.0 %   MCV 91 79 - 97 fL   MCH 31.0 26.6 - 33.0 pg   MCHC 34.2 31.5 - 35.7 g/dL   RDW 12.0 11.6 - 15.4 %   Platelets 254 150 - 450 x10E3/uL   Neutrophils 53 Not Estab. %   Lymphs 36 Not Estab. %   Monocytes 7 Not Estab. %   Eos 3 Not Estab. %   Basos 1 Not Estab. %   Neutrophils Absolute 3.7 1.4 - 7.0 x10E3/uL   Lymphocytes Absolute 2.5 0.7 - 3.1 x10E3/uL   Monocytes Absolute 0.5 0.1 - 0.9 x10E3/uL   EOS (ABSOLUTE) 0.2 0.0 - 0.4 x10E3/uL   Basophils Absolute 0.0 0.0 - 0.2 x10E3/uL    Immature Granulocytes 0 Not Estab. %   Immature Grans (Abs) 0.0 0.0 - 0.1 x10E3/uL  Urinalysis  Result Value Ref Range   Specific Gravity, UA 1.020 1.005 - 1.030   pH, UA 7.5 5.0 - 7.5   Color, UA Orange Yellow   Appearance Ur Clear Clear   Leukocytes,UA Negative Negative   Protein,UA Negative Negative/Trace   Glucose, UA Negative Negative   Ketones, UA Negative Negative   RBC, UA Negative Negative   Bilirubin, UA Negative Negative   Urobilinogen, Ur 1.0 0.2 - 1.0 mg/dL   Nitrite, UA Negative Negative     Ear Cerumen Removal  Date/Time: 09/12/2019 2:36 PM Performed by: Baruch Gouty, FNP Authorized by: Baruch Gouty, FNP   Anesthesia: Local Anesthetic: none Location details: right ear Patient tolerance: patient tolerated the procedure well with no immediate complications Comments: TM WNL post irrigation Procedure type: irrigation  Sedation: Patient sedated: no   Ear Cerumen Removal  Date/Time: 09/12/2019 2:36 PM Performed by: Baruch Gouty, FNP Authorized by: Baruch Gouty, FNP   Anesthesia: Local Anesthetic: none Location details: left ear Patient tolerance: patient tolerated the procedure well with no immediate complications Comments: TM WNL post irrigation Procedure type: curette  Sedation: Patient sedated: no       Pertinent labs & imaging results that were available during my care of the patient were reviewed by me and considered in my medical decision making.  Assessment & Plan:  Marzell was seen today for annual exam.  Diagnoses and all orders for this visit:  Annual physical exam Health maintenance discussed in detail. Labs pending.  -     Lipid panel -     Thyroid Panel With TSH -     Urinalysis, Complete  Left lower quadrant abdominal pain Will recheck urine. CT scheduled for 09/21/2019.  -     Urinalysis, Complete  Other fatigue CBC and CMP normal on 09/06/2019, all normal. Will check thyroid function today.  -     Thyroid Panel  With TSH  Screening for lipid disorders -     Lipid panel  Bilateral impacted cerumen Cerumen successful in office. TMs WNL post removal.  -     Ear Cerumen Removal -     Ear Cerumen Removal  Gastroesophageal reflux disease without esophagitis No red flags present. Diet discussed. Avoid fried, spicy, fatty, greasy, and acidic foods. Avoid caffeine, nicotine, and alcohol. Do not eat 2-3 hours before bedtime and stay upright for at least 1-2 hours after eating. Eat small frequent meals. Avoid NSAID's like motrin and aleve. Medications as prescribed. Report any new or worsening symptoms. Follow up as discussed or sooner if needed.    Tetanus vaccination updated  today.    Continue all other maintenance medications.  Follow up plan: Return in about 3 months (around 12/12/2019), or if symptoms worsen or fail to improve, for abdominal pain.    Continue healthy lifestyle choices, including diet (rich in fruits, vegetables, and lean proteins, and low in salt and simple carbohydrates) and exercise (at least 30 minutes of moderate physical activity daily).  Educational handout given for health maintenance.   The above assessment and management plan was discussed with the patient. The patient verbalized understanding of and has agreed to the management plan. Patient is aware to call the clinic if they develop any new symptoms or if symptoms persist or worsen. Patient is aware when to return to the clinic for a follow-up visit. Patient educated on when it is appropriate to go to the emergency department.   Monia Pouch, FNP-C [facility] [phone]

## 2019-09-13 LAB — THYROID PANEL WITH TSH
Free Thyroxine Index: 2.3 (ref 1.2–4.9)
T3 Uptake Ratio: 30 % (ref 24–39)
T4, Total: 7.6 ug/dL (ref 4.5–12.0)
TSH: 1.07 u[IU]/mL (ref 0.450–4.500)

## 2019-09-13 LAB — LIPID PANEL
Chol/HDL Ratio: 2.6 ratio (ref 0.0–5.0)
Cholesterol, Total: 157 mg/dL (ref 100–199)
HDL: 61 mg/dL (ref >39)
LDL Chol Calc (NIH): 74 mg/dL (ref 0–99)
Triglycerides: 124 mg/dL (ref 0–149)
VLDL Cholesterol Cal: 22 mg/dL (ref 5–40)

## 2019-09-19 ENCOUNTER — Telehealth: Payer: Self-pay | Admitting: Family Medicine

## 2019-09-19 NOTE — Telephone Encounter (Signed)
Pt called stating that he received a telephone call regarding a CT scan that he was set up for and was told that with his insurance he would still have to pay about $1000. Pt says he cant afford that and wants to know what his other options are.

## 2019-09-20 NOTE — Telephone Encounter (Signed)
Aware.Can call pre-certification at [phone] to discuss .  Appointment for CT at [facility] was cancelled.

## 2019-09-21 ENCOUNTER — Ambulatory Visit (HOSPITAL_COMMUNITY): Payer: Commercial Managed Care - PPO

## 2019-10-29 MED ORDER — SODIUM CHLORIDE 0.9 % IV SOLN
1000.00 | INTRAVENOUS | Status: DC
Start: ? — End: 2019-10-29

## 2019-11-02 ENCOUNTER — Ambulatory Visit: Payer: Commercial Managed Care - PPO | Admitting: Family Medicine

## 2019-11-02 ENCOUNTER — Telehealth: Payer: Self-pay | Admitting: Family Medicine

## 2019-11-02 NOTE — Telephone Encounter (Signed)
Pt was supposed to have Hospital follow up appt with Dr Dettinger today but says his pharmacy still hasnt filled his Rx's that he was given at the hospital. Pt said he would call pharmacy again today to see when the Rx's will be filled and will call us to reschedule his appt.

## 2019-12-05 DIAGNOSIS — I480 Paroxysmal atrial fibrillation: Secondary | ICD-10-CM | POA: Insufficient documentation

## 2020-03-20 ENCOUNTER — Telehealth: Payer: Self-pay

## 2020-03-20 NOTE — Telephone Encounter (Signed)
Yes go ahead and have him use MiraLAX every day to clean everything out and drink plenty of water and fluids and Gatorade to help stay hydrated until he comes and sees me.  This would likely get more diarrhea but it will get rid of the constipation hopefully clear out the abdominal pain.

## 2020-03-20 NOTE — Telephone Encounter (Signed)
Patient aware and advised to keep appointment

## 2020-03-20 NOTE — Telephone Encounter (Signed)
Patient states that he is having urinary urgency, Lower abdominal pain and mixture between constipation and diarrhea.  Advised patient that he can eat more fiber or take miralax.  Patient verbalized understanding

## 2020-03-25 ENCOUNTER — Ambulatory Visit: Payer: Commercial Managed Care - PPO | Admitting: Nurse Practitioner

## 2020-03-25 ENCOUNTER — Encounter: Payer: Self-pay | Admitting: Nurse Practitioner

## 2020-03-25 ENCOUNTER — Other Ambulatory Visit: Payer: Self-pay

## 2020-03-25 ENCOUNTER — Ambulatory Visit (INDEPENDENT_AMBULATORY_CARE_PROVIDER_SITE_OTHER): Payer: Commercial Managed Care - PPO

## 2020-03-25 VITALS — BP 134/83 | HR 103 | Temp 96.9°F | Ht 69.0 in | Wt 148.6 lb

## 2020-03-25 DIAGNOSIS — R350 Frequency of micturition: Secondary | ICD-10-CM

## 2020-03-25 DIAGNOSIS — R1032 Left lower quadrant pain: Secondary | ICD-10-CM | POA: Diagnosis not present

## 2020-03-25 DIAGNOSIS — R1030 Lower abdominal pain, unspecified: Secondary | ICD-10-CM | POA: Insufficient documentation

## 2020-03-25 LAB — MICROSCOPIC EXAMINATION
Bacteria, UA: NONE SEEN
Epithelial Cells (non renal): NONE SEEN /hpf (ref 0–10)
RBC, Urine: NONE SEEN /hpf (ref 0–2)
WBC, UA: NONE SEEN /hpf (ref 0–5)

## 2020-03-25 LAB — URINALYSIS, COMPLETE
Bilirubin, UA: NEGATIVE
Glucose, UA: NEGATIVE
Ketones, UA: NEGATIVE
Leukocytes,UA: NEGATIVE
Nitrite, UA: NEGATIVE
Protein,UA: NEGATIVE
RBC, UA: NEGATIVE
Specific Gravity, UA: >1.03 — ABNORMAL HIGH (ref 1.005–1.030)
Urobilinogen, Ur: 0.2 mg/dL (ref 0.2–1.0)
pH, UA: 5 (ref 5.0–7.5)

## 2020-03-25 NOTE — Assessment & Plan Note (Signed)
Patient is a 34 year old male who presents to clinic for left lower quadrant abdominal pain.  This is a recurrent problem for patient.  Patient reports in the last 3 weeks pain in the left lower quadrant became unbearable.  On assessment pain is worse after palpation with a small palpable nodule.  Patient pain radiates to left lower groin..  Patient reports that he has had weight loss even without trying.  Denies fever, nausea and vomiting. KUB completed, urinalysis completed, results pending. Provided education to patient with printed handouts given. Depending on lab results patient may be referred to GI.

## 2020-03-25 NOTE — Patient Instructions (Signed)
Urinary Frequency, Adult Urinary frequency means urinating more often than usual. You may urinate every 1-2 hours even though you drink a normal amount of fluid and do not have a bladder infection or condition. Although you urinate more often than normal, the total amount of urine produced in a day is normal. With urinary frequency, you may have an urgent need to urinate often. The stress and anxiety of needing to find a bathroom quickly can make this urge worse. This condition may go away on its own or you may need treatment at home. Home treatment may include bladder training, exercises, taking medicines, or making changes to your diet. Follow these instructions at home: Bladder health   Keep a bladder diary if told by your health care provider. Keep track of: ? What you eat and drink. ? How often you urinate. ? How much you urinate.  Follow a bladder training program if told by your health care provider. This may include: ? Learning to delay going to the bathroom. ? Double urinating (voiding). This helps if you are not completely emptying your bladder. ? Scheduled voiding.  Do Kegel exercises as told by your health care provider. Kegel exercises strengthen the muscles that help control urination, which may help the condition. Eating and drinking  If told by your health care provider, make diet changes, such as: ? Avoiding caffeine. ? Drinking fewer fluids, especially alcohol. ? Not drinking in the evening. ? Avoiding foods or drinks that may irritate the bladder. These include coffee, tea, soda, artificial sweeteners, citrus, tomato-based foods, and chocolate. ? Eating foods that help prevent or ease constipation. Constipation can make this condition worse. Your health care provider may recommend that you:  Drink enough fluid to keep your urine pale yellow.  Take over-the-counter or prescription medicines.  Eat foods that are high in fiber, such as beans, whole grains, and fresh  fruits and vegetables.  Limit foods that are high in fat and processed sugars, such as fried or sweet foods. General instructions  Take over-the-counter and prescription medicines only as told by your health care provider.  Keep all follow-up visits as told by your health care provider. This is important. Contact a health care provider if:  You start urinating more often.  You feel pain or irritation when you urinate.  You notice blood in your urine.  Your urine looks cloudy.  You develop a fever.  You begin vomiting. Get help right away if:  You are unable to urinate. Summary  Urinary frequency means urinating more often than usual. With urinary frequency, you may urinate every 1-2 hours even though you drink a normal amount of fluid and do not have a bladder infection or other bladder condition.  Your health care provider may recommend that you keep a bladder diary, follow a bladder training program, or make dietary changes.  If told by your health care provider, do Kegel exercises to strengthen the muscles that help control urination.  Take over-the-counter and prescription medicines only as told by your health care provider.  Contact a health care provider if your symptoms do not improve or get worse. This information is not intended to replace advice given to you by your health care provider. Make sure you discuss any questions you have with your health care provider. Document Revised: 12/08/2017 Document Reviewed: 12/08/2017 Elsevier Patient Education  2020 [employer] Abdominal Pain, Adult Many things can cause belly (abdominal) pain. Most times, belly pain is not dangerous. Many cases of belly  pain can be watched and treated at home. Sometimes, though, belly pain is serious. Your doctor will try to find the cause of your belly pain. Follow these instructions at home:  Medicines  Take over-the-counter and prescription medicines only as told by your doctor.  Do  not take medicines that help you poop (laxatives) unless told by your doctor. General instructions  Watch your belly pain for any changes.  Drink enough fluid to keep your pee (urine) pale yellow.  Keep all follow-up visits as told by your doctor. This is important. Contact a doctor if:  Your belly pain changes or gets worse.  You are not hungry, or you lose weight without trying.  You are having trouble pooping (constipated) or have watery poop (diarrhea) for more than 2-3 days.  You have pain when you pee or poop.  Your belly pain wakes you up at night.  Your pain gets worse with meals, after eating, or with certain foods.  You are vomiting and cannot keep anything down.  You have a fever.  You have blood in your pee. Get help right away if:  Your pain does not go away as soon as your doctor says it should.  You cannot stop vomiting.  Your pain is only in areas of your belly, such as the right side or the left lower part of the belly.  You have bloody or black poop, or poop that looks like tar.  You have very bad pain, cramping, or bloating in your belly.  You have signs of not having enough fluid or water in your body (dehydration), such as: ? Dark pee, very little pee, or no pee. ? Cracked lips. ? Dry mouth. ? Sunken eyes. ? Sleepiness. ? Weakness.  You have trouble breathing or chest pain. Summary  Many cases of belly pain can be watched and treated at home.  Watch your belly pain for any changes.  Take over-the-counter and prescription medicines only as told by your doctor.  Contact a doctor if your belly pain changes or gets worse.  Get help right away if you have very bad pain, cramping, or bloating in your belly. This information is not intended to replace advice given to you by your health care provider. Make sure you discuss any questions you have with your health care provider. Document Revised: 10/09/2018 Document Reviewed: 10/09/2018 Elsevier  Patient Education  2020 [employer].

## 2020-03-25 NOTE — Progress Notes (Signed)
Acute Office Visit  Subjective:    Patient ID: Marc Roy, male    DOB: January 30, 1986, 34 y.o.   MRN: [id number]  Chief Complaint  Patient presents with   Abdominal Pain    left    Urinary Frequency   Knot on Chest    Abdominal Pain This is a recurrent problem. The current episode started more than 1 month ago. The problem occurs constantly. The problem has been unchanged. The pain is located in the LLQ. The pain is at a severity of 4/10. The quality of the pain is aching. The abdominal pain radiates to the suprapubic region. Associated symptoms include weight loss. The pain is aggravated by palpation. The pain is relieved by nothing. He has tried nothing for the symptoms.       Past Surgical History:  Procedure Laterality Date   WISDOM TOOTH EXTRACTION      Family History  Problem Relation Age of Onset   Alzheimer's disease Maternal Grandmother    Alzheimer's disease Paternal Grandmother       Outpatient Medications Prior to Visit  Medication Sig Dispense Refill   dicyclomine (BENTYL) 10 MG capsule Take 1 capsule (10 mg total) by mouth 4 (four) times daily -  before meals and at bedtime. 120 capsule 3   omeprazole (PRILOSEC) 20 MG capsule Take 1 capsule (20 mg total) by mouth daily. 30 capsule 3   No facility-administered medications prior to visit.    Allergies  Allergen Reactions   Bee Venom Swelling   Amoxicillin    Penicillins     Review of Systems  Constitutional: Positive for weight loss.  Gastrointestinal: Positive for abdominal pain.  All other systems reviewed and are negative.      Objective:    Physical Exam Vitals reviewed.  Constitutional:      Appearance: He is well-developed.  HENT:     Head: Normocephalic.     Mouth/Throat:     Mouth: Mucous membranes are moist.  Cardiovascular:     Rate and Rhythm: Normal rate and regular rhythm.     Heart sounds: Normal heart sounds.  Pulmonary:     Effort: Pulmonary effort is  normal.     Breath sounds: Normal breath sounds.  Abdominal:     General: Bowel sounds are normal.     Palpations: Abdomen is soft.     Tenderness: There is abdominal tenderness in the left lower quadrant. There is no rebound. Negative signs include Murphy's sign.  Musculoskeletal:        General: No tenderness.  Skin:    General: Skin is warm.  Neurological:     Mental Status: He is alert and oriented to person, place, and time.  Psychiatric:        Mood and Affect: Mood normal.        Behavior: Behavior normal.     There were no vitals taken for this visit. Wt Readings from Last 3 Encounters:  09/12/19 154 lb 3.2 oz (69.9 kg)  03/16/17 167 lb 9.6 oz (76 kg)  01/27/17 169 lb 6.4 oz (76.8 kg)    Health Maintenance Due  Topic Date Due   Hepatitis C Screening  Never done   COVID-19 Vaccine (1) Never done   INFLUENZA VACCINE  Never done      Lab Results  Component Value Date   TSH 1.070 09/12/2019   Lab Results  Component Value Date   WBC 6.8 09/06/2019   HGB 16.1 09/06/2019  HCT 47.1 09/06/2019   MCV 91 09/06/2019   PLT 254 09/06/2019   Lab Results  Component Value Date   NA 143 09/06/2019   K 3.9 09/06/2019   CO2 26 09/06/2019   GLUCOSE 92 09/06/2019   BUN 19 09/06/2019   CREATININE 0.91 09/06/2019   BILITOT 0.5 09/06/2019   ALKPHOS 50 09/06/2019   AST 34 09/06/2019   ALT 32 09/06/2019   PROT 7.2 09/06/2019   ALBUMIN 4.9 09/06/2019   CALCIUM 9.4 09/06/2019   Lab Results  Component Value Date   CHOL 157 09/12/2019   Lab Results  Component Value Date   HDL 61 09/12/2019   Lab Results  Component Value Date   LDLCALC 74 09/12/2019   Lab Results  Component Value Date   TRIG 124 09/12/2019   Lab Results  Component Value Date   CHOLHDL 2.6 09/12/2019   No results found for: HGBA1C     Assessment & Plan:   Problem List Items Addressed This Visit      Other   Frequency of urination - Primary   Relevant Orders   Urinalysis, Complete  (Completed)   DG Abd 1 View   Left lower quadrant abdominal pain    Patient is a 34 year old male who presents to clinic for left lower quadrant abdominal pain.  This is a recurrent problem for patient.  Patient reports in the last 3 weeks pain in the left lower quadrant became unbearable.  On assessment pain is worse after palpation with a small palpable nodule.  Patient pain radiates to left lower groin..  Patient reports that he has had weight loss even without trying.  Denies fever, nausea and vomiting. KUB completed, urinalysis completed, results pending. Provided education to patient with printed handouts given. Depending on lab results patient may be referred to GI.      Relevant Orders   DG Abd 1 View       No orders of the defined types were placed in this encounter.    Daryll Drown, NP

## 2020-03-28 ENCOUNTER — Other Ambulatory Visit: Payer: Self-pay | Admitting: Nurse Practitioner

## 2020-04-01 ENCOUNTER — Other Ambulatory Visit: Payer: Self-pay | Admitting: Nurse Practitioner

## 2020-04-01 DIAGNOSIS — R1030 Lower abdominal pain, unspecified: Secondary | ICD-10-CM

## 2020-04-09 ENCOUNTER — Ambulatory Visit (HOSPITAL_COMMUNITY): Payer: Commercial Managed Care - PPO

## 2020-05-28 ENCOUNTER — Other Ambulatory Visit: Payer: Self-pay

## 2020-05-28 ENCOUNTER — Telehealth (INDEPENDENT_AMBULATORY_CARE_PROVIDER_SITE_OTHER): Payer: Commercial Managed Care - PPO | Admitting: Nurse Practitioner

## 2020-05-28 DIAGNOSIS — R059 Cough, unspecified: Secondary | ICD-10-CM

## 2020-05-28 DIAGNOSIS — J3489 Other specified disorders of nose and nasal sinuses: Secondary | ICD-10-CM | POA: Insufficient documentation

## 2020-05-28 MED ORDER — AZITHROMYCIN 250 MG PO TABS: ORAL_TABLET | ORAL | 0 refills | Status: DC

## 2020-05-28 NOTE — Progress Notes (Addendum)
   Virtual Visit via telephone Note Due to COVID-19 pandemic this visit was conducted virtually. This visit type was conducted due to national recommendations for restrictions regarding the COVID-19 Pandemic (e.g. social distancing, sheltering in place) in an effort to limit this patient's exposure and mitigate transmission in our community. All issues noted in this document were discussed and addressed.  A physical exam was not performed with this format.  I connected with Marc Roy on 05/28/20 at 10:28 am  by telephone and verified that I am speaking with the correct person using two identifiers. Marc Roy is currently located at home and no one is currently with patient during visit. The provider, Daryll Drown, NP is located in their office at time of visit.  I discussed the limitations, risks, security and privacy concerns of performing an evaluation and management service by telephone and the availability of in person appointments. I also discussed with the patient that there may be a patient responsible charge related to this service. The patient expressed understanding and agreed to proceed.   History and Present Illness:  Cough This is a new problem. The current episode started in the past 7 days. The problem has been gradually improving. The problem occurs constantly. The cough is non-productive. Associated symptoms include headaches, nasal congestion and a sore throat. Pertinent negatives include no chest pain, chills, ear congestion, ear pain, fever, rash, shortness of breath or wheezing. Nothing aggravates the symptoms. He has tried nothing for the symptoms.      Review of Systems  Constitutional: Negative for chills and fever.  HENT: Positive for sore throat. Negative for ear pain.   Respiratory: Positive for cough. Negative for shortness of breath and wheezing.   Cardiovascular: Negative for chest pain.  Gastrointestinal: Negative for nausea.  Skin: Negative for  rash.  Neurological: Positive for headaches.     Observations/Objective: Hello visit  Assessment and Plan: Cough Patient is a 34 year old male who is being seen via virtual visit for cough, sore throat and congestion.  Patient is experiencing sinus pressure and headache behind his left eye in the last few days. Advised patient to continue hydration, Tylenol for pain, COVID-19 swab ordered. Patient treated for sinusitis-Z-Pak ordered, over-the-counter cough suppressant.  Education provided to patient.  Rx sent to pharmacy follow-up with worsening or unresolved symptoms.     Follow Up Instructions: Follow-up with worsening unresolved symptoms.    I discussed the assessment and treatment plan with the patient. The patient was provided an opportunity to ask questions and all were answered. The patient agreed with the plan and demonstrated an understanding of the instructions.   The patient was advised to call back or seek an in-person evaluation if the symptoms worsen or if the condition fails to improve as anticipated.  The above assessment and management plan was discussed with the patient. The patient verbalized understanding of and has agreed to the management plan. Patient is aware to call the clinic if symptoms persist or worsen. Patient is aware when to return to the clinic for a follow-up visit. Patient educated on when it is appropriate to go to the emergency department.   Time call ended: 10:14 AM  I provided 14 minutes of non-face-to-face time during this encounter.    Daryll Drown, NP

## 2020-05-28 NOTE — Addendum Note (Signed)
Addended by: Magdalene River on: 05/28/2020 11:58 AM   Modules accepted: Orders

## 2020-05-28 NOTE — Assessment & Plan Note (Addendum)
Patient is a 34 year old male who is being seen via virtual visit for cough, sore throat and congestion.  Patient is experiencing sinus pressure and headache behind his left eye in the last few days. Advised patient to continue hydration, Tylenol for pain, COVID-19 swab ordered. Patient treated for sinusitis-Z-Pak ordered.  Over-the-counter cough suppressant. Education provided to patient.  Rx sent to pharmacy follow-up with worsening or unresolved symptoms.

## 2020-05-28 NOTE — Assessment & Plan Note (Signed)
Sinus pressure not well controlled.  Routine test ordered, labs pending.  We will treat appropriately.

## 2020-05-29 LAB — NOVEL CORONAVIRUS, NAA: SARS-CoV-2, NAA: NOT DETECTED

## 2020-05-29 LAB — SARS-COV-2, NAA 2 DAY TAT

## 2020-08-11 ENCOUNTER — Telehealth: Payer: Self-pay | Admitting: Nurse Practitioner

## 2020-08-11 NOTE — Telephone Encounter (Signed)
Please schedule patient for an OV for reevaluation and referral.

## 2020-08-11 NOTE — Telephone Encounter (Signed)
Pt would like to be referred to urologist or GI dr because he is having issues with going to the bathroom, cramping, stomach pains. Mainly lower left abdominal. Left testicle also feels more tender. Stated that he has spoke to [facility] about this previously but was not referred. Please call back and advise pt. Did not want to make appt until he knew if Je would refer without having an appt.

## 2020-08-12 NOTE — Telephone Encounter (Signed)
Called patient, no answer, left detailed message to call office and schedule re eval and referral.

## 2020-08-13 ENCOUNTER — Ambulatory Visit (INDEPENDENT_AMBULATORY_CARE_PROVIDER_SITE_OTHER): Payer: Commercial Managed Care - PPO | Admitting: Nurse Practitioner

## 2020-08-13 ENCOUNTER — Encounter: Payer: Self-pay | Admitting: Nurse Practitioner

## 2020-08-13 DIAGNOSIS — R1032 Left lower quadrant pain: Secondary | ICD-10-CM

## 2020-08-13 NOTE — Assessment & Plan Note (Signed)
Unresolved symptoms of left lower quadrant abdominal pain.  Referral to GI completed.

## 2020-08-13 NOTE — Progress Notes (Signed)
   Virtual Visit via telephone Note Due to COVID-19 pandemic this visit was conducted virtually. This visit type was conducted due to national recommendations for restrictions regarding the COVID-19 Pandemic (e.g. social distancing, sheltering in place) in an effort to limit this patient's exposure and mitigate transmission in our community. All issues noted in this document were discussed and addressed.  A physical exam was not performed with this format.  I connected with [name] on 08/13/20 at 2:40 pm by telephone and verified that I am speaking with the correct person using two identifiers. Marc Roy is currently located at home during visit. The provider, Daryll Drown, NP is located in their office at time of visit.  I discussed the limitations, risks, security and privacy concerns of performing an evaluation and management service by telephone and the availability of in person appointments. I also discussed with the patient that there may be a patient responsible charge related to this service. The patient expressed understanding and agreed to proceed.   History and Present Illness:  HPI  Patient continues to report ongoing abdominal discomfort in left lower quadrant with pressure.  Patient reports the need to have a bowel movement but unable to and when he does have a bowel movement the pain is better but comes right back.  No nausea or vomiting, no fever or chills.  No changes in appetite, no malaise or body/joint pain.  ROS   Observations/Objective: Televisit  Assessment and Plan:  Left lower quadrant abdominal pain Unresolved symptoms of left lower quadrant abdominal pain.  Referral to GI completed.     Follow Up Instructions: Follow-up worsening unresolved symptoms    I discussed the assessment and treatment plan with the patient. The patient was provided an opportunity to ask questions and all were answered. The patient agreed with the plan and  demonstrated an understanding of the instructions.   The patient was advised to call back or seek an in-person evaluation if the symptoms worsen or if the condition fails to improve as anticipated.  The above assessment and management plan was discussed with the patient. The patient verbalized understanding of and has agreed to the management plan. Patient is aware to call the clinic if symptoms persist or worsen. Patient is aware when to return to the clinic for a follow-up visit. Patient educated on when it is appropriate to go to the emergency department.   Time call ended: 2:48 PM  I provided 8 minutes of non-face-to-face time during this encounter.    Daryll Drown, NP

## 2020-08-18 ENCOUNTER — Encounter: Payer: Self-pay | Admitting: Internal Medicine

## 2020-09-15 ENCOUNTER — Encounter: Payer: Self-pay | Admitting: Family Medicine

## 2020-09-15 ENCOUNTER — Ambulatory Visit (INDEPENDENT_AMBULATORY_CARE_PROVIDER_SITE_OTHER): Payer: Commercial Managed Care - PPO | Admitting: Family Medicine

## 2020-09-15 DIAGNOSIS — J0100 Acute maxillary sinusitis, unspecified: Secondary | ICD-10-CM | POA: Diagnosis not present

## 2020-09-15 MED ORDER — LEVOFLOXACIN 500 MG PO TABS: 500.0000 mg | ORAL_TABLET | Freq: Every day | ORAL | 0 refills | Status: DC

## 2020-09-15 NOTE — Progress Notes (Signed)
    Subjective:    Patient ID: Marc Roy, male    DOB: Nov 06, 1985, 35 y.o.   MRN: [id number]   HPI: Marc Roy is a 35 y.o. male presenting for pain behind the left eye. Eye is itching and red. Sensitive to light, but vision is normal. Posterior drainage with pressure. Denies fever. Had left sided HA. Onset was 3 days ago.    Depression screen [facility] 2/9 03/25/2020 09/12/2019 08/27/2019 03/16/2017 01/27/2017  Decreased Interest 0 0 0 0 0  Down, Depressed, Hopeless 0 0 0 0 0  PHQ - 2 Score 0 0 0 0 0     Relevant past medical, surgical, family and social history reviewed and updated as indicated.  Interim medical history since our last visit reviewed. Allergies and medications reviewed and updated.  ROS:  Review of Systems  Constitutional: Negative for activity change, appetite change, chills and fever.  HENT: Positive for congestion, postnasal drip, rhinorrhea and sinus pressure. Negative for ear discharge, ear pain, hearing loss, nosebleeds, sneezing and trouble swallowing.   Respiratory: Negative for chest tightness and shortness of breath.   Cardiovascular: Negative for chest pain and palpitations.  Skin: Negative for rash.     Social History   Tobacco Use  Smoking Status Former Smoker  . Types: Cigarettes  . Quit date: 2012  . Years since quitting: 10.2  Smokeless Tobacco Never Used       Objective:     Wt Readings from Last 3 Encounters:  03/25/20 148 lb 9.6 oz (67.4 kg)  09/12/19 154 lb 3.2 oz (69.9 kg)  03/16/17 167 lb 9.6 oz (76 kg)     Exam deferred. Pt. Harboring due to COVID 19. Phone visit performed.   Assessment & Plan:   1. Acute maxillary sinusitis, recurrence not specified     Meds ordered this encounter  Medications  . levofloxacin (LEVAQUIN) 500 MG tablet    Sig: Take 1 tablet (500 mg total) by mouth daily. For 10 days    Dispense:  10 tablet    Refill:  0    No orders of the defined types were placed in this encounter.      Diagnoses and all orders for this visit:  Acute maxillary sinusitis, recurrence not specified  Other orders -     levofloxacin (LEVAQUIN) 500 MG tablet; Take 1 tablet (500 mg total) by mouth daily. For 10 days    Virtual Visit via telephone Note  I discussed the limitations, risks, security and privacy concerns of performing an evaluation and management service by telephone and the availability of in person appointments. The patient was identified with two identifiers. Pt.expressed understanding and agreed to proceed. Pt. Is at home. Dr. Darlyn Read is in his office.  Follow Up Instructions:   I discussed the assessment and treatment plan with the patient. The patient was provided an opportunity to ask questions and all were answered. The patient agreed with the plan and demonstrated an understanding of the instructions.   The patient was advised to call back or seek an in-person evaluation if the symptoms worsen or if the condition fails to improve as anticipated.   Total minutes including chart review and phone contact time: 7   Follow up plan: Return if symptoms worsen or fail to improve.  Mechele Claude, MD [place] [facility]

## 2020-09-18 ENCOUNTER — Ambulatory Visit: Payer: Commercial Managed Care - PPO | Admitting: Internal Medicine

## 2020-09-18 ENCOUNTER — Encounter: Payer: Self-pay | Admitting: Internal Medicine

## 2020-09-18 ENCOUNTER — Other Ambulatory Visit: Payer: Self-pay

## 2020-09-18 VITALS — BP 134/97 | HR 106 | Temp 97.3°F | Ht 69.0 in | Wt 156.4 lb

## 2020-09-18 DIAGNOSIS — R1032 Left lower quadrant pain: Secondary | ICD-10-CM | POA: Diagnosis not present

## 2020-09-18 DIAGNOSIS — K219 Gastro-esophageal reflux disease without esophagitis: Secondary | ICD-10-CM | POA: Diagnosis not present

## 2020-09-18 DIAGNOSIS — K5900 Constipation, unspecified: Secondary | ICD-10-CM

## 2020-09-18 NOTE — Patient Instructions (Signed)
I want you to start taking over-the-counter MiraLAX (polyethylene glycol) 1 capful daily when you feel that you are getting constipated.  You can increase this to 2 capfuls daily as needed.  If you experience a flare in the next 3 months prior to follow-up, I want you to call the office and I will order a abdominal x-ray and urinalysis to look for potential kidney stones.  Otherwise follow-up in 3 months.  At [facility] we value your feedback. You may receive a survey about your visit today. Please share your experience as we strive to create trusting relationships with our patients to provide genuine, compassionate, quality care.  We appreciate your understanding and patience as we review any laboratory studies, imaging, and other diagnostic tests that are ordered as we care for you. Our office policy is 5 business days for review of these results, and any emergent or urgent results are addressed in a timely manner for your best interest. If you do not hear from our office in 1 week, please contact us.   We also encourage the use of MyChart, which contains your medical information for your review as well. If you are not enrolled in this feature, an access code is on this after visit summary for your convenience. Thank you for allowing [place] to be involved in your care.  It was great to see you today!  I hope you have a great rest of your spring!!    Hennie Duos. Marletta Lor, D.O. Gastroenterology and Hepatology [facility]

## 2020-09-18 NOTE — Progress Notes (Signed)
Primary Care Physician:  Daryll Drown, NP Primary Gastroenterologist:  Dr. Marletta Lor  Chief Complaint  Patient presents with  . Abdominal Pain    LLQ, comes and goes. Inconsistent bm. When has bm during episodes of abd pain its constipation. Has increased urination during episodes.  . Diarrhea    occ    HPI:   Marc Roy is a 35 y.o. male who presents to the clinic today by referral from his PCP for evaluation for left lower quadrant abdominal pain.  Patient notes severe left lower quadrant/inguinal pain.  This is intermittent.  When he has these flares they are severe.  Patient had an x-ray 03/25/2020 which showed left pelvic calcification typical of a phlebolith but technically indeterminate cannot rule out distal ureteral stone.  Also noted moderate stool burden.  Patient denies any melena hematochezia.  No mucus in his stool.  Currently not having any pain at present.  No family history of colorectal malignancy or inflammatory bowel disease.  Does note inconsistent bowel movements with constipation.  Also states when the flares happen he does note increased urination.  No testicular pain.  Also has chronic GERD which is well controlled on omeprazole.  Past Medical History:  Diagnosis Date  . Allergic rhinitis   . GERD (gastroesophageal reflux disease)     Past Surgical History:  Procedure Laterality Date  . WISDOM TOOTH EXTRACTION      Current Outpatient Medications  Medication Sig Dispense Refill  . levofloxacin (LEVAQUIN) 500 MG tablet Take 1 tablet (500 mg total) by mouth daily. For 10 days 10 tablet 0  . omeprazole (PRILOSEC) 20 MG capsule Take 1 capsule (20 mg total) by mouth daily. (Patient taking differently: Take 20 mg by mouth as needed.) 30 capsule 3  . azithromycin (ZITHROMAX) 250 MG tablet 2 tablets by mouth day 1, 1 tablet by mouth day 2-5 (Patient not taking: Reported on 09/18/2020) 6 tablet 0   No current facility-administered medications for this visit.     Allergies as of 09/18/2020 - Review Complete 09/18/2020  Allergen Reaction Noted  . Bee venom Swelling 12/05/2019  . Amoxicillin  06/02/2012  . Penicillins  06/02/2012    Family History  Problem Relation Age of Onset  . Alzheimer's disease Maternal Grandmother   . Alzheimer's disease Paternal Grandmother     Social History   Socioeconomic History  . Marital status: Single    Spouse name: Not on file  . Number of children: Not on file  . Years of education: Not on file  . Highest education level: Not on file  Occupational History  . Not on file  Tobacco Use  . Smoking status: Former Smoker    Types: Cigarettes    Quit date: 2012    Years since quitting: 10.2  . Smokeless tobacco: Never Used  Vaping Use  . Vaping Use: Never used  Substance and Sexual Activity  . Alcohol use: Yes    Alcohol/week: 21.0 standard drinks    Types: 21 Cans of beer per week    Comment: 09/18/20 "very little"  . Drug use: Yes    Types: Marijuana  . Sexual activity: Not on file  Other Topics Concern  . Not on file  Social History Narrative  . Not on file   Social Determinants of Health   Financial Resource Strain: Not on file  Food Insecurity: Not on file  Transportation Needs: Not on file  Physical Activity: Not on file  Stress: Not on  file  Social Connections: Not on file  Intimate Partner Violence: Not on file    Subjective: Review of Systems  Constitutional: Negative for chills and fever.  HENT: Negative for congestion and hearing loss.   Eyes: Negative for blurred vision and double vision.  Respiratory: Negative for cough and shortness of breath.   Cardiovascular: Negative for chest pain and palpitations.  Gastrointestinal: Positive for abdominal pain. Negative for blood in stool, constipation, diarrhea, heartburn, melena and vomiting.  Genitourinary: Negative for dysuria and urgency.  Musculoskeletal: Negative for joint pain and myalgias.  Skin: Negative for itching and  rash.  Neurological: Negative for dizziness and headaches.  Psychiatric/Behavioral: Negative for depression. The patient is not nervous/anxious.        Objective: BP (!) 134/97   Pulse (!) 106   Temp (!) 97.3 F (36.3 C) (Temporal)   Ht 5\' 9"  (1.753 m)   Wt 156 lb 6.4 oz (70.9 kg)   BMI 23.10 kg/m  Physical Exam Constitutional:      Appearance: Normal appearance.  HENT:     Head: Normocephalic and atraumatic.  Eyes:     Extraocular Movements: Extraocular movements intact.     Conjunctiva/sclera: Conjunctivae normal.  Cardiovascular:     Rate and Rhythm: Normal rate and regular rhythm.  Pulmonary:     Effort: Pulmonary effort is normal.     Breath sounds: Normal breath sounds.  Abdominal:     General: Bowel sounds are normal.     Palpations: Abdomen is soft.  Musculoskeletal:        General: Normal range of motion.     Cervical back: Normal range of motion and neck supple.  Skin:    General: Skin is warm.  Neurological:     General: No focal deficit present.     Mental Status: He is alert and oriented to person, place, and time.  Psychiatric:        Mood and Affect: Mood normal.        Behavior: Behavior normal.      Assessment: *GERD-well controlled on as needed Omeprazole *LLQ abdominal pain *Constipation   Plan: GERD well-controlled on as needed omeprazole.  We will continue.  No alarm symptoms to warrant EGD at this juncture.  Etiology of his abdominal pain unclear.  He does have signs of constipation which may be playing a role.  I am going to start him on MiraLAX 1 capful daily.  If this is not adequate that he will need to increase to 2 capfuls daily.  Does have this incidental finding of potential distal ureteral stone though unclear clinical significance.  Patient to call office when he has another flare and we will order urinalysis as well as x-ray.  May need to consider colonoscopy and/or further imaging with CT scan pending clinical  course.  Patient follow-up in 3 months.  09/18/2020 3:47 PM   Disclaimer: This note was dictated with voice recognition software. Similar sounding words can inadvertently be transcribed and may not be corrected upon review.

## 2020-09-19 ENCOUNTER — Telehealth: Payer: Self-pay | Admitting: Internal Medicine

## 2020-09-19 DIAGNOSIS — R1032 Left lower quadrant pain: Secondary | ICD-10-CM

## 2020-09-19 NOTE — Telephone Encounter (Signed)
PATIENT CALLED AND SAID HE WAS SUPPOSED TO LET DR CARVER KNOW IF HE HAD ANYMORE ABDOMINAL PAIN AND HE SAID HE DOES. STATED DR. CARVER WAS GOING TO SEND HIM FOR AN XRAY IF THE PAIN CONTINUED.  WAS AN ORDER PLACED? [phone]

## 2020-09-22 ENCOUNTER — Other Ambulatory Visit: Payer: Self-pay

## 2020-09-22 DIAGNOSIS — R109 Unspecified abdominal pain: Secondary | ICD-10-CM

## 2020-09-22 DIAGNOSIS — Z79899 Other long term (current) drug therapy: Secondary | ICD-10-CM

## 2020-09-22 NOTE — Addendum Note (Signed)
Addended by: Armstead Peaks on: 09/22/2020 09:04 AM   Modules accepted: Orders

## 2020-09-22 NOTE — Telephone Encounter (Signed)
Lab orders placed. Faxed to [facility]. Lmom, pt can get UA at [facility] or [facility].

## 2020-09-22 NOTE — Telephone Encounter (Addendum)
Per AVS: Instructions  I want you to start taking over-the-counter MiraLAX (polyethylene glycol) 1 capful daily when you feel that you are getting constipated.  You can increase this to 2 capfuls daily as needed.  If you experience a flare in the next 3 months prior to follow-up, I want you to call the office and I will order a abdominal x-ray and urinalysis to look for potential kidney stones.  Otherwise follow-up in 3 months.    ----  abd xray ordered. Looks like Dr. Marletta Lor also wants patient to have UA done as well. fowarding to AM to place lab order as dena is out.  Called pt, LMOVM to call back to let know order placed for abd xray

## 2020-09-23 ENCOUNTER — Ambulatory Visit (HOSPITAL_COMMUNITY)
Admission: RE | Admit: 2020-09-23 | Discharge: 2020-09-23 | Disposition: A | Discharge status: Home / Self Care | Payer: Commercial Managed Care - PPO | Source: Ambulatory Visit | Attending: Internal Medicine | Admitting: Internal Medicine

## 2020-09-23 ENCOUNTER — Other Ambulatory Visit: Payer: Self-pay

## 2020-09-23 DIAGNOSIS — R1032 Left lower quadrant pain: Secondary | ICD-10-CM

## 2020-09-23 LAB — URINALYSIS, ROUTINE W REFLEX MICROSCOPIC
Bilirubin Urine: NEGATIVE
Glucose, UA: NEGATIVE mg/dL
Hgb urine dipstick: NEGATIVE
Ketones, ur: NEGATIVE mg/dL
Leukocytes,Ua: NEGATIVE
Nitrite: NEGATIVE
Protein, ur: NEGATIVE mg/dL
Specific Gravity, Urine: 1.019 (ref 1.005–1.030)
pH: 5 (ref 5.0–8.0)

## 2020-12-09 ENCOUNTER — Encounter: Payer: Self-pay | Admitting: Internal Medicine

## 2021-03-04 ENCOUNTER — Encounter: Payer: Self-pay | Admitting: Nurse Practitioner

## 2021-03-04 ENCOUNTER — Ambulatory Visit: Payer: Commercial Managed Care - PPO | Admitting: Nurse Practitioner

## 2021-03-04 ENCOUNTER — Other Ambulatory Visit: Payer: Self-pay

## 2021-03-04 VITALS — BP 129/83 | HR 107 | Temp 97.4°F | Ht 69.0 in | Wt 162.0 lb

## 2021-03-04 DIAGNOSIS — G4730 Sleep apnea, unspecified: Secondary | ICD-10-CM

## 2021-03-04 NOTE — Patient Instructions (Signed)

## 2021-03-04 NOTE — Progress Notes (Signed)
Is currently followed  Acute Office Visit  Subjective:    Patient ID: Marc Roy, male    DOB: November 21, 1985, 35 y.o.   MRN: [id number]  Chief Complaint  Patient presents with  . Snoring    HPI Patient is in today because his girlfriend has noticed he stops breathing at nigh due to heavy snoring and may have sleep apnea Patient reports increase day time tired ness but no other symptoms associated.   Past Medical History:  Diagnosis Date  . Allergic rhinitis   . GERD (gastroesophageal reflux disease)     Past Surgical History:  Procedure Laterality Date  . WISDOM TOOTH EXTRACTION      Family History  Problem Relation Age of Onset  . Alzheimer's disease Maternal Grandmother   . Alzheimer's disease Paternal Grandmother     Social History   Socioeconomic History  . Marital status: Single    Spouse name: Not on file  . Number of children: Not on file  . Years of education: Not on file  . Highest education level: Not on file  Occupational History  . Not on file  Tobacco Use  . Smoking status: Former    Types: Cigarettes    Quit date: 2012    Years since quitting: 10.7  . Smokeless tobacco: Never  Vaping Use  . Vaping Use: Never used  Substance and Sexual Activity  . Alcohol use: Yes    Alcohol/week: 21.0 standard drinks    Types: 21 Cans of beer per week    Comment: 09/18/20 "very little"  . Drug use: Yes    Types: Marijuana  . Sexual activity: Not on file  Other Topics Concern  . Not on file  Social History Narrative  . Not on file   Social Determinants of Health   Financial Resource Strain: Not on file  Food Insecurity: Not on file  Transportation Needs: Not on file  Physical Activity: Not on file  Stress: Not on file  Social Connections: Not on file  Intimate Partner Violence: Not on file    Outpatient Medications Prior to Visit  Medication Sig Dispense Refill  . azithromycin (ZITHROMAX) 250 MG tablet 2 tablets by mouth day 1, 1 tablet by mouth  day 2-5 (Patient not taking: Reported on 09/18/2020) 6 tablet 0  . levofloxacin (LEVAQUIN) 500 MG tablet Take 1 tablet (500 mg total) by mouth daily. For 10 days 10 tablet 0  . omeprazole (PRILOSEC) 20 MG capsule Take 1 capsule (20 mg total) by mouth daily. (Patient taking differently: Take 20 mg by mouth as needed.) 30 capsule 3   No facility-administered medications prior to visit.    Allergies  Allergen Reactions  . Bee Venom Swelling  . Amoxicillin   . Penicillins     Review of Systems  Constitutional: Negative.   HENT: Negative.    Eyes: Negative.   Respiratory:  Positive for apnea.   Cardiovascular: Negative.   Gastrointestinal: Negative.   All other systems reviewed and are negative.     Objective:    Physical Exam Vitals and nursing note reviewed.  Constitutional:      Appearance: Normal appearance.  HENT:     Head: Normocephalic.     Nose: Nose normal. No congestion.  Eyes:     Conjunctiva/sclera: Conjunctivae normal.  Cardiovascular:     Rate and Rhythm: Normal rate and regular rhythm.     Pulses: Normal pulses.     Heart sounds: Normal heart sounds.  Pulmonary:  Effort: Pulmonary effort is normal.     Breath sounds: Normal breath sounds.  Abdominal:     General: Bowel sounds are normal.  Neurological:     Mental Status: He is alert and oriented to person, place, and time.  Psychiatric:        Behavior: Behavior normal.    BP 129/83   Pulse (!) 107   Temp (!) 97.4 F (36.3 C) (Temporal)   Ht 5\' 9"  (1.753 m)   Wt 162 lb (73.5 kg)   BMI 23.92 kg/m  Wt Readings from Last 3 Encounters:  03/04/21 162 lb (73.5 kg)  09/18/20 156 lb 6.4 oz (70.9 kg)  03/25/20 148 lb 9.6 oz (67.4 kg)    Health Maintenance Due  Topic Date Due  . COVID-19 Vaccine (1) Never done  . Hepatitis C Screening  Never done  . INFLUENZA VACCINE  Never done    There are no preventive care reminders to display for this patient.   Lab Results  Component Value Date   TSH  1.070 09/12/2019   Lab Results  Component Value Date   WBC 6.8 09/06/2019   HGB 16.1 09/06/2019   HCT 47.1 09/06/2019   MCV 91 09/06/2019   PLT 254 09/06/2019   Lab Results  Component Value Date   NA 143 09/06/2019   K 3.9 09/06/2019   CO2 26 09/06/2019   GLUCOSE 92 09/06/2019   BUN 19 09/06/2019   CREATININE 0.91 09/06/2019   BILITOT 0.5 09/06/2019   ALKPHOS 50 09/06/2019   AST 34 09/06/2019   ALT 32 09/06/2019   PROT 7.2 09/06/2019   ALBUMIN 4.9 09/06/2019   CALCIUM 9.4 09/06/2019   Lab Results  Component Value Date   CHOL 157 09/12/2019   Lab Results  Component Value Date   HDL 61 09/12/2019   Lab Results  Component Value Date   LDLCALC 74 09/12/2019   Lab Results  Component Value Date   TRIG 124 09/12/2019   Lab Results  Component Value Date   CHOLHDL 2.6 09/12/2019   No results found for: HGBA1C     Assessment & Plan:   Problem List Items Addressed This Visit       Respiratory   Sleep apnea - Primary    Referral to sleep study completed. Education on snoring and sleep apnea provided to patient with printed hand outs give,.      Relevant Orders   Ambulatory referral to Sleep Studies     No orders of the defined types were placed in this encounter.    09/14/2019, NP

## 2021-03-06 NOTE — Assessment & Plan Note (Signed)
Referral to sleep study completed. Education on snoring and sleep apnea provided to patient with printed hand outs give,.

## 2021-05-26 ENCOUNTER — Encounter: Payer: Self-pay | Admitting: Neurology

## 2021-05-26 ENCOUNTER — Ambulatory Visit (INDEPENDENT_AMBULATORY_CARE_PROVIDER_SITE_OTHER): Payer: Commercial Managed Care - PPO | Admitting: Neurology

## 2021-05-26 VITALS — BP 129/81 | HR 98 | Ht 69.0 in | Wt 164.0 lb

## 2021-05-26 DIAGNOSIS — R0683 Snoring: Secondary | ICD-10-CM

## 2021-05-26 DIAGNOSIS — J351 Hypertrophy of tonsils: Secondary | ICD-10-CM

## 2021-05-26 DIAGNOSIS — R0681 Apnea, not elsewhere classified: Secondary | ICD-10-CM | POA: Diagnosis not present

## 2021-05-26 DIAGNOSIS — Z9189 Other specified personal risk factors, not elsewhere classified: Secondary | ICD-10-CM

## 2021-05-26 DIAGNOSIS — J342 Deviated nasal septum: Secondary | ICD-10-CM | POA: Diagnosis not present

## 2021-05-26 NOTE — Patient Instructions (Signed)

## 2021-05-26 NOTE — Progress Notes (Signed)
Subjective:    Patient ID: Marc Roy is a 35 y.o. male.  HPI    Marc Foley, MD, PhD [facility] [address], [address] [address] [place], [place] [address]  Dear Marc Roy,   I saw your patient, Marc Roy, upon your kind request in my sleep clinic today for initial consultation of his sleep disorder, in particular, concern for underlying obstructive sleep apnea.  The patient is unaccompanied today.  As you know, Marc Roy is a 35 year old right-handed gentleman with an underlying medical history of reflux disease and allergic rhinitis, who reports snoring and witnessed apneas per girlfriend's report as well as daytime tiredness.  I reviewed your office note from 03/04/2021.  His Epworth sleepiness score is 7/24, fatigue severity score 29 out of 63.  Girlfriend, he has a 61 year old son who stays with him every other weekend.  The patient quit smoking cigarettes some 11 years ago, he smokes marijuana daily.  He drinks alcohol rarely, caffeine in the form of coffee, 1 large cup in the morning.  His snoring can be loud and apneas long per girlfriend's description.  He denies night to night nocturia, he has occasionally woken up with a headache.  Bedtime is generally between 830 and 9 PM and rise time around 5 AM.  He works a full-time job as a Administrator, sports and part-time as a Counsellor, he comes home late about 3-4 nights a week after his second job.  He had a nasal injury when he was in ninth grade.  He suspects that he had a nasal fracture at the time but never had a corrected or looked at.  His Past Medical History Is Significant For: Past Medical History:  Diagnosis Date   Allergic rhinitis    GERD (gastroesophageal reflux disease)     His Past Surgical History Is Significant For: Past Surgical History:  Procedure Laterality Date   WISDOM TOOTH EXTRACTION      His Family History Is Significant For: Family History  Problem Relation Age of  Onset   Alzheimer's disease Maternal Grandmother    Alzheimer's disease Paternal Grandmother    Sleep apnea Neg Hx     His Social History Is Significant For: Social History   Socioeconomic History   Marital status: Single    Spouse name: Not on file   Number of children: Not on file   Years of education: Not on file   Highest education level: Not on file  Occupational History   Not on file  Tobacco Use   Smoking status: Former    Types: Cigarettes    Quit date: 2012    Years since quitting: 10.9   Smokeless tobacco: Never  Vaping Use   Vaping Use: Never used  Substance and Sexual Activity   Alcohol use: Yes    Alcohol/week: 21.0 standard drinks    Types: 21 Cans of beer per week    Comment: 09/18/20 "very little"   Drug use: Yes    Types: Marijuana   Sexual activity: Yes    Birth control/protection: None  Other Topics Concern   Not on file  Social History Narrative   Not on file   Social Determinants of Health   Financial Resource Strain: Not on file  Food Insecurity: Not on file  Transportation Needs: Not on file  Physical Activity: Not on file  Stress: Not on file  Social Connections: Not on file    His Allergies Are:  Allergies  Allergen  Reactions   Bee Venom Swelling   Amoxicillin    Penicillins   :   His Current Medications Are:  Outpatient Encounter Medications as of 05/26/2021  Medication Sig   omeprazole (PRILOSEC) 20 MG capsule Take 1 capsule (20 mg total) by mouth daily. (Patient taking differently: Take 20 mg by mouth as needed.)   azithromycin (ZITHROMAX) 250 MG tablet 2 tablets by mouth day 1, 1 tablet by mouth day 2-5   levofloxacin (LEVAQUIN) 500 MG tablet Take 1 tablet (500 mg total) by mouth daily. For 10 days   No facility-administered encounter medications on file as of 05/26/2021.  :   Review of Systems:  Out of a complete 14 point review of systems, all are reviewed and negative with the exception of these symptoms as listed  below:  Review of Systems  Neurological:        Pt is her for sleep consult . Pt states he snores at night , fatigue throughout the day, and at times slight headaches during the day  . Pt  states he does feel lightheaded during the day. Pt states BP baseline is 130/80 . Pt denies sleep study sand CPAP at home  ESS:7 FSS:25   Objective:  Neurological Exam  Physical Exam Physical Examination:   Vitals:   05/26/21 1537  BP: 129/81  Pulse: 98    General Examination: The patient is a very pleasant 35 y.o. male in no acute distress. He appears well-developed and well-nourished and well groomed.   HEENT: Normocephalic, atraumatic, pupils are equal, round and reactive to light, extraocular tracking is good without limitation to gaze excursion or nystagmus noted. Hearing is grossly intact. Face is symmetric with normal facial animation. Speech is clear with no dysarthria noted. There is no hypophonia. There is no lip, neck/head, jaw or voice tremor. Neck is supple with full range of passive and active motion. There are no carotid bruits on auscultation. Oropharynx exam reveals: mild mouth dryness, good dental hygiene and moderate airway crowding, due to small airway entry, tonsillar hypertrophy noted, Mallampati class II, wider tongue.  Tongue protrudes centrally and palate elevates symmetrically, neck circumference of 14 7/8 inches.  Nasal inspection reveals deviated septum to the left.  Chest: Clear to auscultation without wheezing, rhonchi or crackles noted.  Heart: S1+S2+0, regular and normal without murmurs, rubs or gallops noted.   Abdomen: Soft, non-tender and non-distended.  Extremities: There is no pitting edema in the distal lower extremities bilaterally.   Skin: Warm and dry without trophic changes noted.   Musculoskeletal: exam reveals no obvious joint deformities, tenderness or joint swelling or erythema.   Neurologically:  Mental status: The patient is awake, alert and  oriented in all 4 spheres. His immediate and remote memory, attention, language skills and fund of knowledge are appropriate. There is no evidence of aphasia, agnosia, apraxia or anomia. Speech is clear with normal prosody and enunciation. Thought process is linear. Mood is normal and affect is normal.  Cranial nerves II - XII are as described above under HEENT exam.  Motor exam: Normal bulk, strength and tone is noted. There is no tremor. Fine motor skills and coordination: grossly intact.  Cerebellar testing: No dysmetria or intention tremor. There is no truncal or gait ataxia.  Sensory exam: intact to light touch in the upper and lower extremities.  Gait, station and balance: He stands easily. No veering to one side is noted. No leaning to one side is noted. Posture is age-appropriate and stance is  narrow based. Gait shows normal stride length and normal pace. No problems turning are noted.  Assessment and Plan:  In summary, Marc Roy is a very pleasant 35 y.o.-year old male with an underlying medical history of reflux disease and allergic rhinitis, whose history and physical exam  are concerning for obstructive sleep apnea (OSA). I had a long chat with the patient about my findings and the diagnosis of OSA, its prognosis and treatment options. We talked about medical treatments, surgical interventions and non-pharmacological approaches. I explained in particular the risks and ramifications of untreated moderate to severe OSA, especially with respect to developing cardiovascular disease down the Road, including congestive heart failure, difficult to treat hypertension, cardiac arrhythmias, or stroke. Even type 2 diabetes has, in part, been linked to untreated OSA. Symptoms of untreated OSA include daytime sleepiness, memory problems, mood irritability and mood disorder such as depression and anxiety, lack of energy, as well as recurrent headaches, especially morning headaches. We talked about  trying to maintain a healthy lifestyle in general, as well as the importance of weight control. We also talked about the importance of good sleep hygiene. I recommended the following at this time: sleep study.  I outlined the differences between a laboratory attended sleep study versus home sleep test. I explained the sleep test procedure to the patient and also outlined possible surgical and non-surgical treatment options of OSA, including the use of a custom-made dental device (which would require a referral to a specialist dentist or oral surgeon), upper airway surgical options, such as traditional UPPP or a novel less invasive surgical option in the form of Inspire hypoglossal nerve stimulation (which would involve a referral to an ENT surgeon). I also explained the CPAP treatment option to the patient, who indicated that he would be willing to try CPAP if the need arises. I explained the importance of being compliant with PAP treatment, not only for insurance purposes but primarily to improve His symptoms, and for the patient's long term health benefit, including to reduce His cardiovascular risks. I answered all his questions today and the patient was in agreement. I plan to see him back after the sleep study is completed and encouraged him to call with any interim questions, concerns, problems or updates.   Thank you very much for allowing me to participate in the care of this nice patient. If I can be of any further assistance to you please do not hesitate to call me at [phone].  Sincerely,   Marc Foley, MD, PhD

## 2021-07-10 ENCOUNTER — Ambulatory Visit: Payer: Commercial Managed Care - PPO | Admitting: Family

## 2021-07-17 ENCOUNTER — Encounter: Payer: Self-pay | Admitting: Nurse Practitioner

## 2021-08-07 ENCOUNTER — Ambulatory Visit (INDEPENDENT_AMBULATORY_CARE_PROVIDER_SITE_OTHER): Payer: Commercial Managed Care - PPO

## 2021-08-07 ENCOUNTER — Ambulatory Visit: Payer: Commercial Managed Care - PPO | Admitting: Nurse Practitioner

## 2021-08-07 ENCOUNTER — Encounter: Payer: Self-pay | Admitting: Nurse Practitioner

## 2021-08-07 VITALS — BP 130/82 | HR 99 | Temp 98.1°F | Ht 69.0 in | Wt 161.0 lb

## 2021-08-07 DIAGNOSIS — R300 Dysuria: Secondary | ICD-10-CM | POA: Diagnosis not present

## 2021-08-07 DIAGNOSIS — R350 Frequency of micturition: Secondary | ICD-10-CM

## 2021-08-07 DIAGNOSIS — R1030 Lower abdominal pain, unspecified: Secondary | ICD-10-CM

## 2021-08-07 LAB — URINALYSIS, ROUTINE W REFLEX MICROSCOPIC
Bilirubin, UA: NEGATIVE
Glucose, UA: NEGATIVE
Ketones, UA: NEGATIVE
Leukocytes,UA: NEGATIVE
Nitrite, UA: NEGATIVE
Protein,UA: NEGATIVE
RBC, UA: NEGATIVE
Specific Gravity, UA: 1.02 (ref 1.005–1.030)
Urobilinogen, Ur: 0.2 mg/dL (ref 0.2–1.0)
pH, UA: 7 (ref 5.0–7.5)

## 2021-08-07 NOTE — Patient Instructions (Signed)
Dysuria °Dysuria is pain or discomfort during urination. The pain or discomfort may be felt in the part of the body that drains urine from the bladder (urethra) or in the surrounding tissue of the genitals. The pain may also be felt in the groin area, lower abdomen, or lower back. °You may have to urinate frequently or have the sudden feeling that you have to urinate (urgency). Dysuria can affect anyone, but it is more common in females. Dysuria can be caused by many different things, including: °Urinary tract infection. °Kidney stones or bladder stones. °Certain STIs (sexually transmitted infections), such as chlamydia. °Dehydration. °Inflammation of the tissues of the vagina. °Use of certain medicines. °Use of certain soaps or scented products that cause irritation. °Follow these instructions at home: °Medicines °Take over-the-counter and prescription medicines only as told by your health care provider. °If you were prescribed an antibiotic medicine, take it as told by your health care provider. Do not stop taking the antibiotic even if you start to feel better. °Eating and drinking ° °Drink enough fluid to keep your urine pale yellow. °Avoid caffeinated beverages, tea, and alcohol. These beverages can irritate the bladder and make dysuria worse. In males, alcohol may irritate the prostate. °General instructions °Watch your condition for any changes. °Urinate often. Avoid holding urine for long periods of time. °If you are male, you should wipe from front to back after urinating or having a bowel movement. Use each piece of toilet paper only once. °Empty your bladder after sex. °Keep all follow-up visits. This is important. °If you had any tests done to find the cause of dysuria, it is up to you to get your test results. Ask your health care provider, or the department that is doing the test, when your results will be ready. °Contact a health care provider if: °You have a fever. °You develop pain in your back or  sides. °You have nausea or vomiting. °You have blood in your urine. °You are not urinating as often as you usually do. °Get help right away if: °Your pain is severe and not relieved with medicines. °You cannot eat or drink without vomiting. °You are confused. °You have a rapid heartbeat while resting. °You have shaking or chills. °You feel extremely weak. °Summary °Dysuria is pain or discomfort while urinating. Many different conditions can lead to dysuria. °If you have dysuria, you may have to urinate frequently or have the sudden feeling that you have to urinate (urgency). °Watch your condition for any changes. Keep all follow-up visits. °Make sure that you urinate often and drink enough fluid to keep your urine pale yellow. °This information is not intended to replace advice given to you by your health care provider. Make sure you discuss any questions you have with your health care provider. °Document Revised: 01/11/2020 Document Reviewed: 01/11/2020 °Elsevier Patient Education © 2022 [employer] °Abdominal Pain, Adult °Many things can cause belly (abdominal) pain. Most times, belly pain is not dangerous. Many cases of belly pain can be watched and treated at home. Sometimes, though, belly pain is serious. Your doctor will try to find the cause of your belly pain. °Follow these instructions at home: °Medicines °Take over-the-counter and prescription medicines only as told by your doctor. °Do not take medicines that help you poop (laxatives) unless told by your doctor. °General instructions °Watch your belly pain for any changes. °Drink enough fluid to keep your pee (urine) pale yellow. °Keep all follow-up visits as told by your doctor. This is important. °  Contact a doctor if: °Your belly pain changes or gets worse. °You are not hungry, or you lose weight without trying. °You are having trouble pooping (constipated) or have watery poop (diarrhea) for more than 2-3 days. °You have pain when you pee or  poop. °Your belly pain wakes you up at night. °Your pain gets worse with meals, after eating, or with certain foods. °You are vomiting and cannot keep anything down. °You have a fever. °You have blood in your pee. °Get help right away if: °Your pain does not go away as soon as your doctor says it should. °You cannot stop vomiting. °Your pain is only in areas of your belly, such as the right side or the left lower part of the belly. °You have bloody or black poop, or poop that looks like tar. °You have very bad pain, cramping, or bloating in your belly. °You have signs of not having enough fluid or water in your body (dehydration), such as: °Dark pee, very little pee, or no pee. °Cracked lips. °Dry mouth. °Sunken eyes. °Sleepiness. °Weakness. °You have trouble breathing or chest pain. °Summary °Many cases of belly pain can be watched and treated at home. °Watch your belly pain for any changes. °Take over-the-counter and prescription medicines only as told by your doctor. °Contact a doctor if your belly pain changes or gets worse. °Get help right away if you have very bad pain, cramping, or bloating in your belly. °This information is not intended to replace advice given to you by your health care provider. Make sure you discuss any questions you have with your health care provider. °Document Revised: 10/09/2018 Document Reviewed: 10/09/2018 °Elsevier Patient Education © 2022 [employer] ° °

## 2021-08-07 NOTE — Progress Notes (Signed)
Acute Office Visit  Subjective:    Patient ID: Marc Roy, male    DOB: 17-Apr-1986, 36 y.o.   MRN: [id number]  Chief Complaint  Patient presents with   dysuria, abdominal pain    Dysuria  This is a new problem. The current episode started yesterday. The problem occurs intermittently. The problem has been gradually worsening. The quality of the pain is described as aching. The pain is moderate. There has been no fever. He is Sexually active. Associated symptoms include flank pain. Pertinent negatives include no chills, discharge, hesitancy or nausea. He has tried nothing for the symptoms.  Abdominal Pain This is a new problem. The current episode started in the past 7 days. The onset quality is sudden. The problem occurs intermittently. The problem has been unchanged. The pain is located in the LLQ. The pain is moderate. The quality of the pain is aching. The abdominal pain does not radiate. Associated symptoms include dysuria. Pertinent negatives include no nausea.    Past Medical History:  Diagnosis Date   Allergic rhinitis    GERD (gastroesophageal reflux disease)     Past Surgical History:  Procedure Laterality Date   WISDOM TOOTH EXTRACTION      Family History  Problem Relation Age of Onset   Alzheimer's disease Maternal Grandmother    Alzheimer's disease Paternal Grandmother    Sleep apnea Neg Hx     Social History   Socioeconomic History   Marital status: Single    Spouse name: Not on file   Number of children: Not on file   Years of education: Not on file   Highest education level: Not on file  Occupational History   Not on file  Tobacco Use   Smoking status: Former    Types: Cigarettes    Quit date: 2012    Years since quitting: 11.1   Smokeless tobacco: Never  Vaping Use   Vaping Use: Never used  Substance and Sexual Activity   Alcohol use: Yes    Alcohol/week: 21.0 standard drinks    Types: 21 Cans of beer per week    Comment: 09/18/20  "very little"   Drug use: Yes    Types: Marijuana   Sexual activity: Yes    Birth control/protection: None  Other Topics Concern   Not on file  Social History Narrative   Not on file   Social Determinants of Health   Financial Resource Strain: Not on file  Food Insecurity: Not on file  Transportation Needs: Not on file  Physical Activity: Not on file  Stress: Not on file  Social Connections: Not on file  Intimate Partner Violence: Not on file    Outpatient Medications Prior to Visit  Medication Sig Dispense Refill   omeprazole (PRILOSEC) 20 MG capsule Take 1 capsule (20 mg total) by mouth daily. (Patient taking differently: Take 20 mg by mouth as needed.) 30 capsule 3   azithromycin (ZITHROMAX) 250 MG tablet 2 tablets by mouth day 1, 1 tablet by mouth day 2-5 6 tablet 0   levofloxacin (LEVAQUIN) 500 MG tablet Take 1 tablet (500 mg total) by mouth daily. For 10 days 10 tablet 0   No facility-administered medications prior to visit.    Allergies  Allergen Reactions   Bee Venom Swelling   Amoxicillin    Penicillins     Review of Systems  Constitutional:  Negative for chills.  HENT: Negative.    Eyes: Negative.   Respiratory: Negative.  Gastrointestinal:  Positive for abdominal pain. Negative for nausea.  Genitourinary:  Positive for dysuria and flank pain. Negative for hesitancy.  All other systems reviewed and are negative.     Objective:    Physical Exam Vitals and nursing note reviewed.  Constitutional:      Appearance: Normal appearance.  HENT:     Head: Normocephalic.     Right Ear: External ear normal.     Left Ear: External ear normal.     Nose: Nose normal.     Mouth/Throat:     Mouth: Mucous membranes are moist.  Eyes:     Conjunctiva/sclera: Conjunctivae normal.  Cardiovascular:     Rate and Rhythm: Normal rate and regular rhythm.     Pulses: Normal pulses.     Heart sounds: Normal heart sounds.  Pulmonary:     Effort: Pulmonary effort is  normal.     Breath sounds: Normal breath sounds.  Abdominal:     General: Bowel sounds are normal.     Tenderness: There is abdominal tenderness. There is left CVA tenderness.  Skin:    General: Skin is warm.     Findings: No rash.  Neurological:     General: No focal deficit present.     Mental Status: He is alert and oriented to person, place, and time.  Psychiatric:        Behavior: Behavior normal.    BP 130/82    Pulse 99    Temp 98.1 F (36.7 C)    Ht 5\' 9"  (1.753 m)    Wt 161 lb (73 kg)    SpO2 96%    BMI 23.78 kg/m  Wt Readings from Last 3 Encounters:  08/07/21 161 lb (73 kg)  05/26/21 164 lb (74.4 kg)  03/04/21 162 lb (73.5 kg)    Health Maintenance Due  Topic Date Due   Hepatitis C Screening  Never done   INFLUENZA VACCINE  Never done    There are no preventive care reminders to display for this patient.   Lab Results  Component Value Date   TSH 1.070 09/12/2019   Lab Results  Component Value Date   WBC 6.8 09/06/2019   HGB 16.1 09/06/2019   HCT 47.1 09/06/2019   MCV 91 09/06/2019   PLT 254 09/06/2019   Lab Results  Component Value Date   NA 143 09/06/2019   K 3.9 09/06/2019   CO2 26 09/06/2019   GLUCOSE 92 09/06/2019   BUN 19 09/06/2019   CREATININE 0.91 09/06/2019   BILITOT 0.5 09/06/2019   ALKPHOS 50 09/06/2019   AST 34 09/06/2019   ALT 32 09/06/2019   PROT 7.2 09/06/2019   ALBUMIN 4.9 09/06/2019   CALCIUM 9.4 09/06/2019   Lab Results  Component Value Date   CHOL 157 09/12/2019   Lab Results  Component Value Date   HDL 61 09/12/2019   Lab Results  Component Value Date   LDLCALC 74 09/12/2019   Lab Results  Component Value Date   TRIG 124 09/12/2019   Lab Results  Component Value Date   CHOLHDL 2.6 09/12/2019   No results found for: HGBA1C     Assessment & Plan:   UTI  vs STD -urinalysis completed results pending -pyridium for pain Precaution and education provided -All questions answered -follow up with unresolved  symptoms   Problem List Items Addressed This Visit       Other   Frequency of urination   Lower abdominal pain  Relevant Orders   DG Abd 1 View   Other Visit Diagnoses     Dysuria    -  Primary   Relevant Orders   Urinalysis, Routine w reflex microscopic (Completed)   CULTURE, URINE COMPREHENSIVE        No orders of the defined types were placed in this encounter.    Daryll Drown, NP

## 2021-08-11 LAB — CULTURE, URINE COMPREHENSIVE

## 2021-08-19 ENCOUNTER — Ambulatory Visit (INDEPENDENT_AMBULATORY_CARE_PROVIDER_SITE_OTHER): Payer: Commercial Managed Care - PPO | Admitting: Neurology

## 2021-08-19 DIAGNOSIS — J351 Hypertrophy of tonsils: Secondary | ICD-10-CM

## 2021-08-19 DIAGNOSIS — R0683 Snoring: Secondary | ICD-10-CM

## 2021-08-19 DIAGNOSIS — J342 Deviated nasal septum: Secondary | ICD-10-CM

## 2021-08-19 DIAGNOSIS — G4733 Obstructive sleep apnea (adult) (pediatric): Secondary | ICD-10-CM

## 2021-08-19 DIAGNOSIS — R0681 Apnea, not elsewhere classified: Secondary | ICD-10-CM

## 2021-08-19 DIAGNOSIS — Z9189 Other specified personal risk factors, not elsewhere classified: Secondary | ICD-10-CM

## 2021-08-20 NOTE — Progress Notes (Unsigned)
° ° ° ° ° ° ° ° ° ° ° ° ° ° ° ° ° ° ° ° ° ° °

## 2021-08-28 NOTE — Procedures (Signed)
?  ? ?  [facility] ? ?HOME SLEEP TEST (Watch PAT) REPORT ? ?STUDY DATE: 08/19/2021 ? ?DOB: 07/08/1985 ? ?MRN: [id number] ? ?ORDERING CLINICIAN: Star Age, MD, PhD ?  ?REFERRING CLINICIAN: Ivy Lynn, NP  ? ?CLINICAL INFORMATION/HISTORY: 36 year old right-handed gentleman with an underlying medical history of reflux disease and allergic rhinitis, who reports snoring and witnessed apneas per girlfriend's report as well as daytime tiredness.  ? ?Epworth sleepiness score: 7/24. ? ?BMI: 24.2 kg/m? ? ?FINDINGS:  ? ?Sleep Summary:  ? ?Total Recording Time (hours, min): 7 hours, 28 minutes ? ?Total Sleep Time (hours, min):  5 hours, 58 minutes  ? ?Percent REM (%):    30.8%  ? ?Respiratory Indices:  ? ?Calculated pAHI (per hour):  4.8/hour        ? ?REM pAHI:    3.8/hour      ? ?NREM pAHI: 5.3/hour ? ?Oxygen Saturation Statistics:  ?  ?Oxygen Saturation (%) Mean: 95%  ? ?Minimum oxygen saturation (%):                 88%  ? ?O2 Saturation Range (%): 88-98%   ? ?O2 Saturation (minutes) <=88%: 0 min ? ?Pulse Rate Statistics:  ? ?Pulse Mean (bpm):    77/min   ? ?Pulse Range (58-115/min)  ? ?IMPRESSION: Primary snoring  ? ?RECOMMENDATION:  ?This home sleep test does not demonstrate any significant obstructive or central sleep disordered breathing, with a borderline AHI of 4.8/h. Some snoring was noted, appeared to be intermittent and in the mild to moderate range for the most part.  Treatment with a positive airway pressure device such as CPAP or AutoPap is not recommended.  Avoidance of the supine sleep position may reduce snoring. Other causes of the patient's symptoms, including circadian rhythm disturbances, an underlying mood disorder, medication effect and/or an underlying medical problem cannot be ruled out based on this test. Clinical correlation is recommended. The patient should be cautioned not to drive, work at heights, or operate dangerous or heavy equipment when tired or sleepy. Review and  reiteration of good sleep hygiene measures should be pursued with any patient. ?The patient can follow up with his referring provider, who will be notified of the test results. An appointment in sleep clinic can be made as necessary.  ? ?I certify that I have reviewed the raw data recording prior to the issuance of this report in accordance with the standards of the American Academy of Sleep Medicine (AASM). ? ?INTERPRETING PHYSICIAN:  ? ?Star Age, MD, PhD  ?Board Certified in Neurology and Sleep Medicine ? ?[facility] ?[address], [address] ?[place], [place] [address] ?[phone] ? ? ? ? ? ? ? ? ? ? ? ? ? ? ? ? ? ? ?

## 2021-09-08 ENCOUNTER — Ambulatory Visit: Payer: Commercial Managed Care - PPO | Admitting: Nurse Practitioner

## 2021-09-08 ENCOUNTER — Encounter: Payer: Self-pay | Admitting: Nurse Practitioner

## 2021-09-08 VITALS — BP 139/89 | HR 116 | Temp 98.5°F | Ht 69.0 in | Wt 160.0 lb

## 2021-09-08 DIAGNOSIS — R21 Rash and other nonspecific skin eruption: Secondary | ICD-10-CM | POA: Diagnosis not present

## 2021-09-08 MED ORDER — TRIAMCINOLONE ACETONIDE 0.1 % EX CREA: 1.0000 "application " | TOPICAL_CREAM | Freq: Two times a day (BID) | CUTANEOUS | 0 refills | Status: DC

## 2021-09-08 NOTE — Patient Instructions (Signed)
Eczema Eczema refers to a group of skin conditions that cause skin to become rough and inflamed. Each type of eczema has different triggers, symptoms, and treatments. Eczema of any type is usually itchy. Symptoms range from mild to severe. Eczema is not spread from person to person (is not contagious). It can appear on different parts of the body at different times. One person's eczema may look different from another person's eczema. What are the causes? The exact cause of this condition is not known. However, exposure to certain environmental factors, irritants, and allergens can make the condition worse. What are the signs or symptoms? Symptoms of this condition depend on the type of eczema you have. The types include: Contact dermatitis. There are two kinds: Irritant contact dermatitis. This happens when something irritates the skin and causes a rash. Allergic contact dermatitis. This happens when your skin comes in contact with something you are allergic to (allergens). This can include poison ivy, chemicals, or medicines that were applied to your skin. Atopic dermatitis. This is a long-term (chronic) skin disease that keeps coming back (recurring). It is the most common type of eczema. Usual symptoms are a red rash and itchy, dry, scaly skin. It usually starts showing signs in infancy and can last through adulthood. Dyshidrotic eczema. This is a form of eczema on the hands and feet. It shows up as very itchy, fluid-filled blisters. It can affect people of any age but is more common before age 40. Hand eczema. This causes very itchy areas of skin on the palms and sides of the hands and fingers. This type of eczema is common in industrial jobs where you may be exposed to different types of irritants. Lichen simplex chronicus. This type of eczema occurs when a person constantly scratches one area of the body. Repeated scratching of the area leads to thickened skin (lichenification). This condition can  accompany other types of eczema. It is more common in adults but may also be seen in children. Nummular eczema. This is a common type of eczema that most often affects the lower legs and the backs of the hands. It typically causes an itchy, red, circular, crusty lesion (plaque). Scratching may become a habit and can cause bleeding. Nummular eczema occurs most often in middle-aged or older people. Seborrheic dermatitis. This is a common skin disease that mainly affects the scalp. It may also affect other oily areas of the body, such as the face, sides of the nose, eyebrows, ears, eyelids, and chest. It is marked by small scaling and redness of the skin (erythema). This can affect people of all ages. In infants, this condition is called cradle cap. Stasis dermatitis. This is a common skin disease that can cause itching, scaling, and hyperpigmentation, usually on the legs and feet. It occurs most often in people who have a condition that prevents blood from being pumped through the veins in the legs (chronic venous insufficiency). Stasis dermatitis is a chronic condition that needs long-term management. How is this diagnosed? This condition may be diagnosed based on: A physical exam of your skin. Your medical history. Skin patch tests. These tests involve using patches that contain possible allergens and placing them on your back. Your health care provider will check in a few days to see if an allergic reaction occurred. How is this treated? Treatment for eczema is based on the type of eczema you have. You may be given hydrocortisone steroid medicine or antihistamines. These can relieve itching quickly and help reduce inflammation.   These may be prescribed or purchased over the counter, depending on the strength that is needed. Follow these instructions at home: Take or apply over-the-counter and prescription medicines only as told by your health care provider. Use creams or ointments to moisturize your  skin. Do not use lotions. Learn what triggers or irritates your symptoms so you can avoid these things. Treat symptom flare-ups quickly. Do not scratch your skin. This can make your rash worse. Keep all follow-up visits. This is important. Where to find more information American Academy of Dermatology: aad.org National Eczema Association: nationaleczema.org The Society for Pediatric Dermatology: pedsderm.net Contact a health care provider if: You have severe itching, even with treatment. You scratch your skin regularly until it bleeds. Your rash looks different than usual. Your skin is painful, swollen, or more red than usual. You have a fever. Summary Eczema refers to a group of skin conditions that cause skin to become rough and inflamed. Each type has different triggers. Eczema of any type causes itching that may range from mild to severe. Treatment varies based on the type of eczema you have. Hydrocortisone steroid medicine or antihistamines can help with itching and inflammation. Protecting your skin is the best way to prevent eczema. Use creams or ointments to moisturize your skin. Avoid triggers and irritants. Treat flare-ups quickly. This information is not intended to replace advice given to you by your health care provider. Make sure you discuss any questions you have with your health care provider. Document Revised: 03/10/2020 Document Reviewed: 03/10/2020 Elsevier Patient Education  2022 Elsevier Inc.  

## 2021-09-08 NOTE — Progress Notes (Signed)
? ?Acute Office Visit ? ?Subjective:  ? ? Patient ID: Marc Roy, male    DOB: 1985/11/28, 36 y.o.   MRN: [id number] ? ?Chief Complaint  ?Patient presents with  ? Rash  ? ? ?Rash ?This is a new problem. The current episode started in the past 7 days. The problem is unchanged. Location: penis. The rash is characterized by burning, dryness, itchiness and redness. He was exposed to nothing. Pertinent negatives include no congestion, cough, fatigue, fever, shortness of breath or sore throat. Past treatments include nothing. The treatment provided no relief. There is no history of allergies.  ? ? ?Past Medical History:  ?Diagnosis Date  ? Allergic rhinitis   ? GERD (gastroesophageal reflux disease)   ? ? ?Past Surgical History:  ?Procedure Laterality Date  ? WISDOM TOOTH EXTRACTION    ? ? ?Family History  ?Problem Relation Age of Onset  ? Alzheimer's disease Maternal Grandmother   ? Alzheimer's disease Paternal Grandmother   ? Sleep apnea Neg Hx   ? ? ?Social History  ? ?Socioeconomic History  ? Marital status: Single  ?  Spouse name: Not on file  ? Number of children: Not on file  ? Years of education: Not on file  ? Highest education level: Not on file  ?Occupational History  ? Not on file  ?Tobacco Use  ? Smoking status: Former  ?  Types: Cigarettes  ?  Quit date: 2012  ?  Years since quitting: 11.2  ? Smokeless tobacco: Never  ?Vaping Use  ? Vaping Use: Never used  ?Substance and Sexual Activity  ? Alcohol use: Yes  ?  Alcohol/week: 21.0 standard drinks  ?  Types: 21 Cans of beer per week  ?  Comment: 09/18/20 "very little"  ? Drug use: Yes  ?  Types: Marijuana  ? Sexual activity: Yes  ?  Birth control/protection: None  ?Other Topics Concern  ? Not on file  ?Social History Narrative  ? Not on file  ? ?Social Determinants of Health  ? ?Financial Resource Strain: Not on file  ?Food Insecurity: Not on file  ?Transportation Needs: Not on file  ?Physical Activity: Not on file  ?Stress: Not on file  ?Social Connections:  Not on file  ?Intimate Partner Violence: Not on file  ? ? ?Outpatient Medications Prior to Visit  ?Medication Sig Dispense Refill  ? omeprazole (PRILOSEC) 20 MG capsule Take 1 capsule (20 mg total) by mouth daily. (Patient taking differently: Take 20 mg by mouth as needed.) 30 capsule 3  ? ?No facility-administered medications prior to visit.  ? ? ?Allergies  ?Allergen Reactions  ? Bee Venom Swelling  ? Amoxicillin   ? Penicillins   ? ? ?Review of Systems  ?Constitutional: Negative.  Negative for fatigue and fever.  ?HENT: Negative.  Negative for congestion and sore throat.   ?Eyes: Negative.   ?Respiratory: Negative.  Negative for cough and shortness of breath.   ?Gastrointestinal: Negative.   ?Genitourinary: Negative.   ?Musculoskeletal: Negative.   ?Skin:  Positive for rash.  ?Psychiatric/Behavioral: Negative.    ?All other systems reviewed and are negative. ? ?   ?Objective:  ?  ?Physical Exam ?Vitals and nursing note reviewed.  ?Constitutional:   ?   Appearance: He is obese.  ?HENT:  ?   Head: Normocephalic.  ?   Right Ear: External ear normal.  ?   Left Ear: External ear normal.  ?   Mouth/Throat:  ?   Mouth: Mucous membranes are  moist.  ?   Pharynx: Oropharynx is clear.  ?Eyes:  ?   Conjunctiva/sclera: Conjunctivae normal.  ?Cardiovascular:  ?   Rate and Rhythm: Normal rate and regular rhythm.  ?Pulmonary:  ?   Effort: Pulmonary effort is normal.  ?   Breath sounds: Normal breath sounds.  ?Abdominal:  ?   General: Bowel sounds are normal.  ?Genitourinary: ?   Penis: Circumcised. Erythema and tenderness present. No discharge or lesions.   ?   Testes: Normal.  ? ? ?   Comments: Friction rub like rash on left lateral side of penis ?Skin: ?   General: Skin is warm.  ?   Findings: Erythema and rash present.  ?Neurological:  ?   Mental Status: He is alert and oriented to person, place, and time.  ?Psychiatric:     ?   Behavior: Behavior normal.  ? ? ?BP 139/89   Pulse (!) 116   Temp 98.5 ?F (36.9 ?C)   Ht 5\' 9"   (1.753 m)   Wt 160 lb (72.6 kg)   SpO2 100%   BMI 23.63 kg/m?  ?Wt Readings from Last 3 Encounters:  ?09/08/21 160 lb (72.6 kg)  ?08/07/21 161 lb (73 kg)  ?05/26/21 164 lb (74.4 kg)  ? ? ?Health Maintenance Due  ?Topic Date Due  ? COVID-19 Vaccine (1) Never done  ? Hepatitis C Screening  Never done  ? INFLUENZA VACCINE  Never done  ? ? ?There are no preventive care reminders to display for this patient. ? ? ?Lab Results  ?Component Value Date  ? TSH 1.070 09/12/2019  ? ?Lab Results  ?Component Value Date  ? WBC 6.8 09/06/2019  ? HGB 16.1 09/06/2019  ? HCT 47.1 09/06/2019  ? MCV 91 09/06/2019  ? PLT 254 09/06/2019  ? ?Lab Results  ?Component Value Date  ? NA 143 09/06/2019  ? K 3.9 09/06/2019  ? CO2 26 09/06/2019  ? GLUCOSE 92 09/06/2019  ? BUN 19 09/06/2019  ? CREATININE 0.91 09/06/2019  ? BILITOT 0.5 09/06/2019  ? ALKPHOS 50 09/06/2019  ? AST 34 09/06/2019  ? ALT 32 09/06/2019  ? PROT 7.2 09/06/2019  ? ALBUMIN 4.9 09/06/2019  ? CALCIUM 9.4 09/06/2019  ? ?Lab Results  ?Component Value Date  ? CHOL 157 09/12/2019  ? ?Lab Results  ?Component Value Date  ? HDL 61 09/12/2019  ? ?Lab Results  ?Component Value Date  ? LDLCALC 74 09/12/2019  ? ?Lab Results  ?Component Value Date  ? TRIG 124 09/12/2019  ? ?Lab Results  ?Component Value Date  ? CHOLHDL 2.6 09/12/2019  ? ?No results found for: HGBA1C ? ?   ?Assessment & Plan:  ? ?Patient presents with rash on his penis.  Symptoms in the past few days.  Patient reports a history of eczema but reports that this rash presented after sex.  On assessment rash presents as friction burn.  Patient reports having sex twice a day every single day. ?I provided education to patient that the best way to avoid friction burn is to reduce vigorous intercourse, avoid tight clothing, and keeping skin moisturized. ? ?Triamcinolone 1% external cream, Tylenol/ibuprofen for pain ?Follow-up with worsening unresolved symptoms. ? ? ?Problem List Items Addressed This Visit   ?None ?Visit Diagnoses    ? ? Rash    -  Primary  ? Relevant Medications  ? triamcinolone cream (KENALOG) 0.1 %  ? ?  ? ? ? ?Meds ordered this encounter  ?Medications  ? triamcinolone cream (  KENALOG) 0.1 %  ?  Sig: Apply 1 application. topically 2 (two) times daily.  ?  Dispense:  30 g  ?  Refill:  0  ?  Order Specific Question:   Supervising Provider  ?  AnswerMechele Claude [[id number]]  ? ? ? ?Daryll Drown, NP ? ?

## 2022-03-01 IMAGING — DX DG ABDOMEN 2V
3 series · 3 of 3 positions shown · non-contrast
Comparison: 03/25/2020

CLINICAL DATA: Abdominal pain

EXAM:
ABDOMEN - 2 VIEW

[abdomen erect]
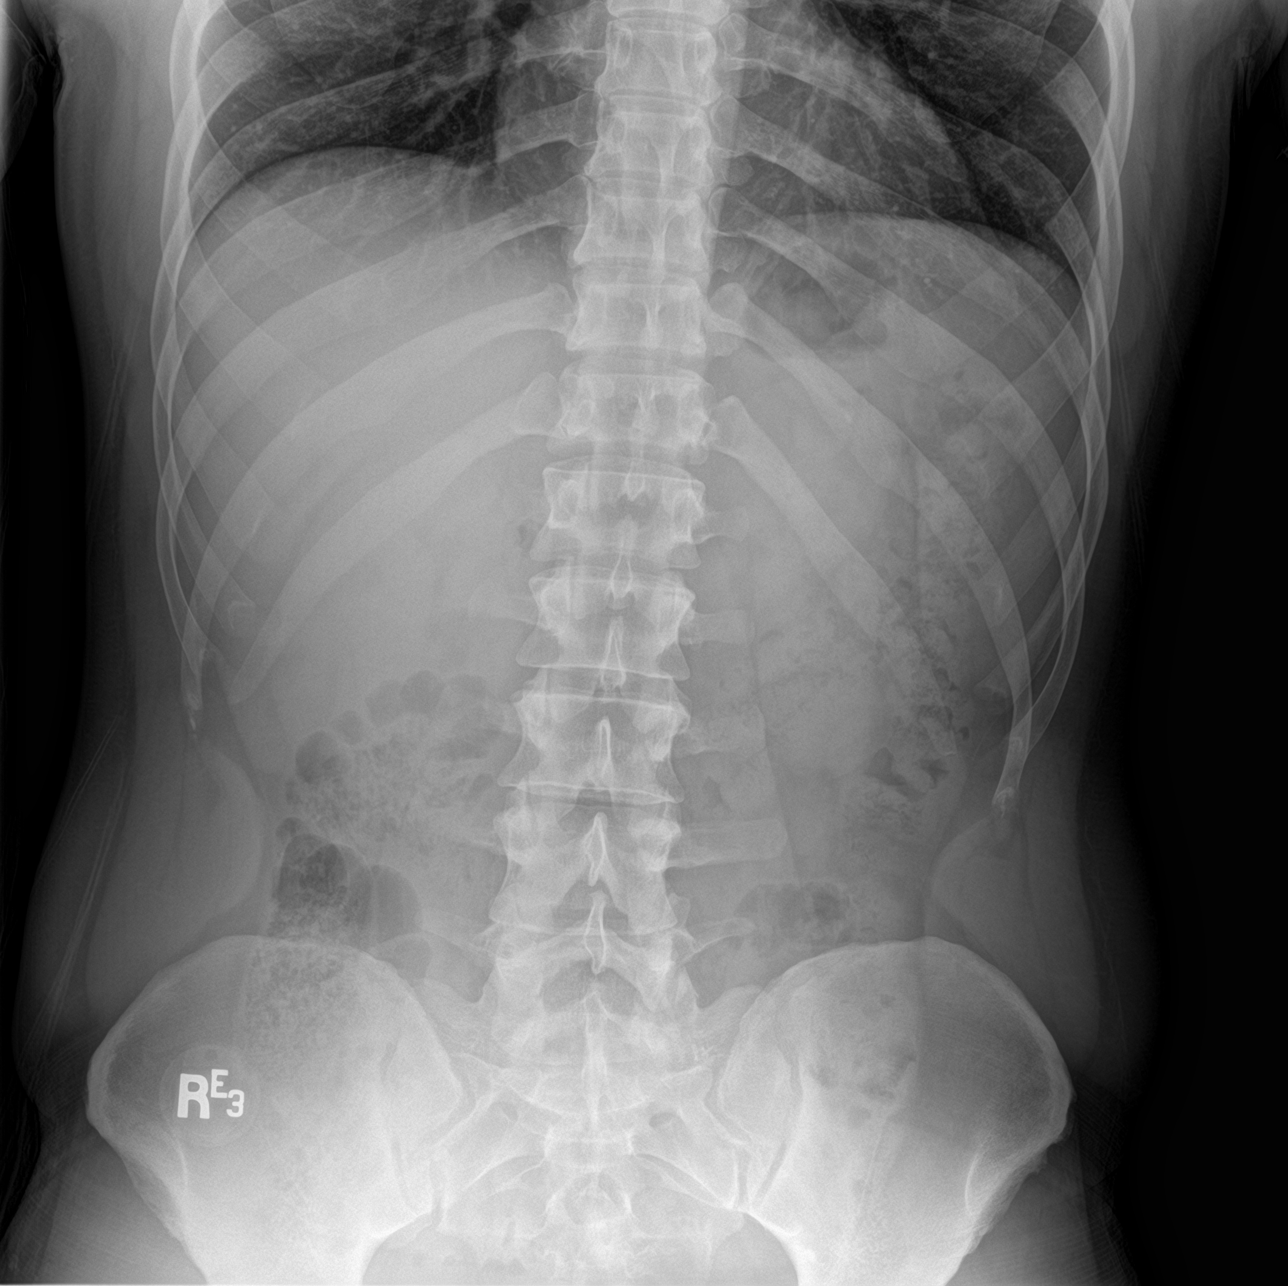

[abdomen supine (1 of 2)]
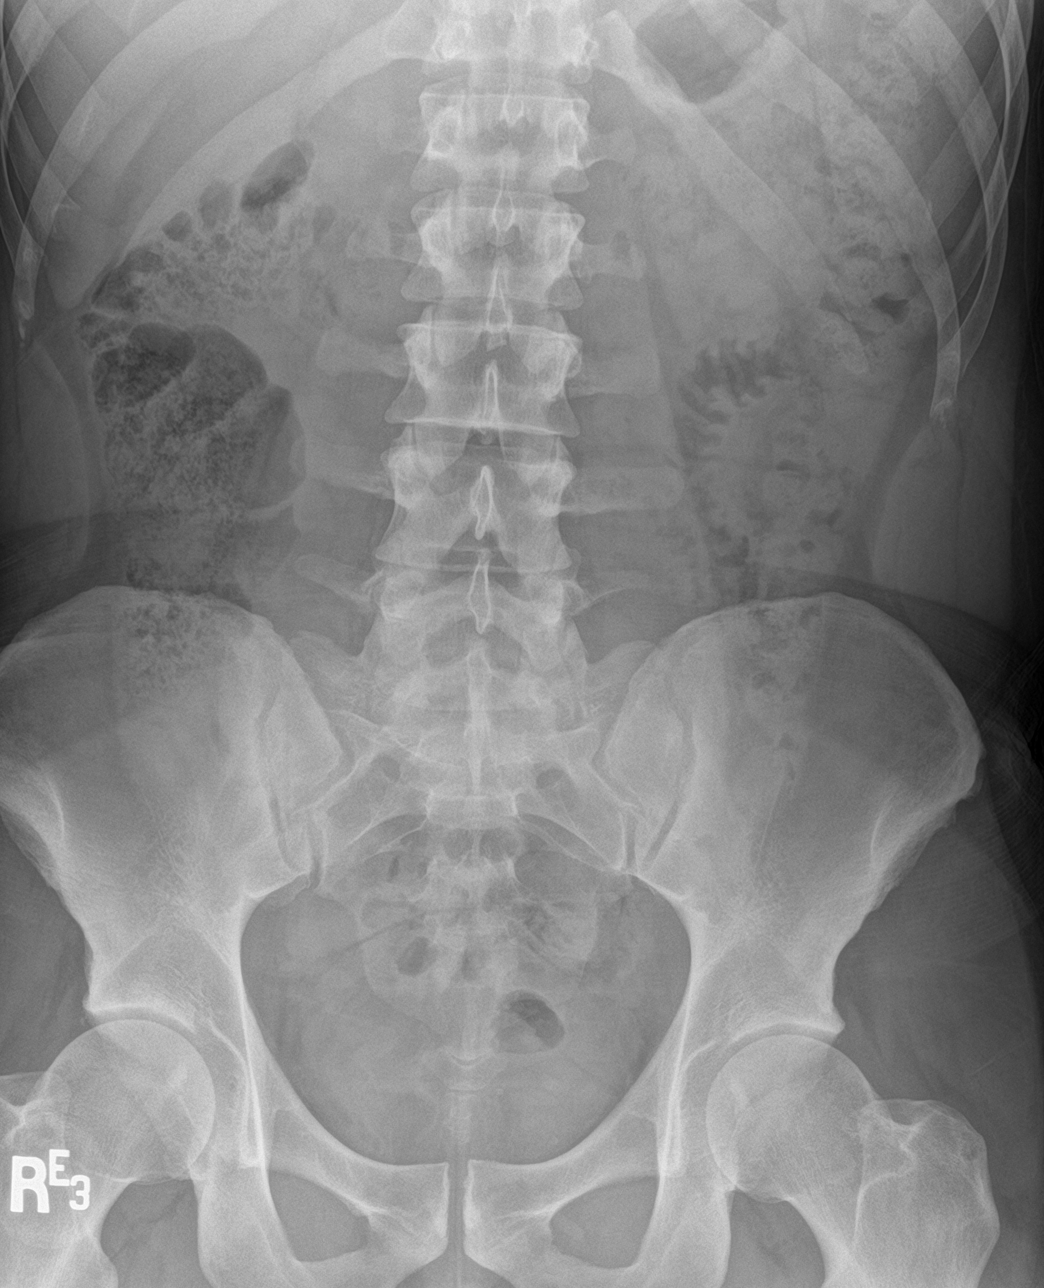

[abdomen supine (2 of 2)]
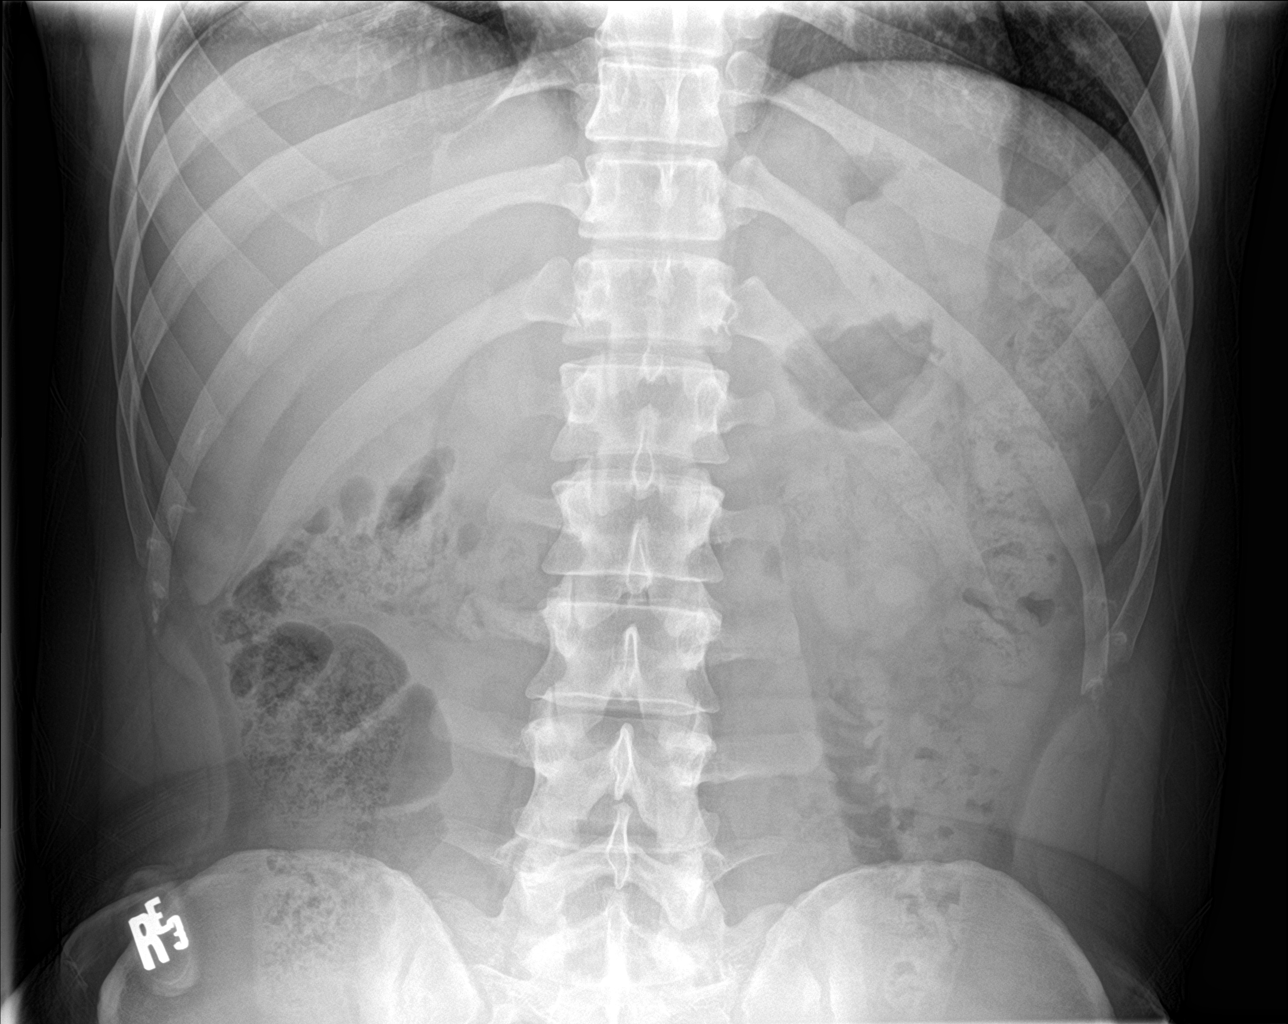

[3 of 3 positions shown; findings below may reference images not displayed]

FINDINGS: The bowel gas pattern is normal. There is no evidence of free air.
No radio-opaque calculi or other significant radiographic
abnormality is seen. Moderate stool in the colon
IMPRESSION: Negative.

## 2022-06-11 ENCOUNTER — Encounter: Payer: Self-pay | Admitting: Family Medicine

## 2022-06-11 ENCOUNTER — Telehealth: Payer: BC Managed Care – PPO | Admitting: Family Medicine

## 2022-06-11 DIAGNOSIS — Z20828 Contact with and (suspected) exposure to other viral communicable diseases: Secondary | ICD-10-CM

## 2022-06-11 MED ORDER — OSELTAMIVIR PHOSPHATE 75 MG PO CAPS: 75.0000 mg | ORAL_CAPSULE | Freq: Two times a day (BID) | ORAL | 0 refills | Status: AC

## 2022-06-11 NOTE — Progress Notes (Signed)
   Virtual Visit via video Note   Due to COVID-19 pandemic this visit was conducted virtually. This visit type was conducted due to national recommendations for restrictions regarding the COVID-19 Pandemic (e.g. social distancing, sheltering in place) in an effort to limit this patient's exposure and mitigate transmission in our community. All issues noted in this document were discussed and addressed.  A physical exam was not performed with this format.  I connected with  Marc Roy  on 06/11/22 at 1301 by video and verified that I am speaking with the correct person using two identifiers. Marc Roy is currently located at work and no one is currently with him during the visit. The provider, Gabriel Earing, FNP is located in their office at time of visit.  I discussed the limitations, risks, security and privacy concerns of performing an evaluation and management service by video  and the availability of in person appointments. I also discussed with the patient that there may be a patient responsible charge related to this service. The patient expressed understanding and agreed to proceed.  CC: flu exposure  History and Present Illness:  Marc Roy has had multiple close flu exposure. His family all have the flu and has tested positive for the flu. He does not have any symptoms currently. He is concerned about getting the flu as his wife is expected to deliver their first baby anytime. He is interested in prophylaxis as he is worried that she will miss the birth if his has flu symptoms.    ROS As per HPI.     Observations/Objective: Alert and oriented x 3. Able to speak in full sentences without difficulty. Respirations unlabored. Non toxic appearing. Normal mood and behavior.    Assessment and Plan: [name] was seen today for flu exposure.  Diagnoses and all orders for this visit:  Exposure to the flu Tamiflu as below.  -     oseltamivir (TAMIFLU) 75 MG capsule; Take 1  capsule (75 mg total) by mouth 2 (two) times daily for 10 days.     Follow Up Instructions: As needed.     I discussed the assessment and treatment plan with the patient. The patient was provided an opportunity to ask questions and all were answered. The patient agreed with the plan and demonstrated an understanding of the instructions.   The patient was advised to call back or seek an in-person evaluation if the symptoms worsen or if the condition fails to improve as anticipated.  The above assessment and management plan was discussed with the patient. The patient verbalized understanding of and has agreed to the management plan. Patient is aware to call the clinic if symptoms persist or worsen. Patient is aware when to return to the clinic for a follow-up visit. Patient educated on when it is appropriate to go to the emergency department.   Time call ended:1306  I provided 5 minutes of face-to-face time during this encounter.    Gabriel Earing, FNP

## 2022-07-27 ENCOUNTER — Encounter: Payer: Self-pay | Admitting: Urology

## 2022-07-27 ENCOUNTER — Ambulatory Visit: Payer: BC Managed Care – PPO | Admitting: Urology

## 2022-07-27 VITALS — BP 145/92 | HR 106

## 2022-07-27 DIAGNOSIS — Z3009 Encounter for other general counseling and advice on contraception: Secondary | ICD-10-CM | POA: Diagnosis not present

## 2022-07-27 MED ORDER — DIAZEPAM 10 MG PO TABS: ORAL_TABLET | ORAL | 0 refills | Status: DC

## 2022-07-27 NOTE — Progress Notes (Signed)
H&P  Chief Complaint: Vasectomy consult  History of Present Illness: 37 year old male self-referred for vasectomy consultation.  He and his fiance have 2 children-a 62 year old boy and a 49-week-old boy.  They both desire permanent sterilization.  He works in a Environmental consultant and sporting Animator.  Past Medical History:  Diagnosis Date   Allergic rhinitis    GERD (gastroesophageal reflux disease)     Past Surgical History:  Procedure Laterality Date   WISDOM TOOTH EXTRACTION      Home Medications:  Allergies as of 07/27/2022       Reactions   Bee Venom Swelling   Amoxicillin    Penicillins         Medication List        Accurate as of July 27, 2022 10:11 AM. If you have any questions, ask your nurse or doctor.          omeprazole 20 MG capsule Commonly known as: PRILOSEC Take 1 capsule (20 mg total) by mouth daily. What changed:  when to take this reasons to take this   triamcinolone cream 0.1 % Commonly known as: KENALOG Apply 1 application. topically 2 (two) times daily.        Allergies:  Allergies  Allergen Reactions   Bee Venom Swelling   Amoxicillin    Penicillins     Family History  Problem Relation Age of Onset   Alzheimer's disease Maternal Grandmother    Alzheimer's disease Paternal Grandmother    Sleep apnea Neg Hx     Social History:  reports that he quit smoking about 12 years ago. His smoking use included cigarettes. He has never used smokeless tobacco. He reports current alcohol use of about 21.0 standard drinks of alcohol per week. He reports current drug use. Drug: Marijuana.  ROS: A complete review of systems was performed.  All systems are negative except for pertinent findings as noted.  Physical Exam:  Vital signs in last 24 hours: There were no vitals taken for this visit. Constitutional:  Alert and oriented, No acute distress Cardiovascular: Regular rate  Respiratory: Normal respiratory  effort Genitourinary: Phallus circumcised.  Scrotal skin normal.  Testicles and cords normal.  Both vas deferens palpable Lymphatic: No lymphadenopathy Neurologic: Grossly intact, no focal deficits Psychiatric: Normal mood and affect  I have reviewed prior pt notes I printed out sleep study for the patient   Impression/Assessment:  Desire for sterility  Plan:  1. We discussed the procedure in detail, and I provided a vasectomy information packet.  He understands that this is a permanent form of sterilization. 2.  We discussed that vasectomy does not produce immediate infertility and he will need his semen sample showing no sperm before he can stop using any other form of contraception.  This typically takes 3 months. 3.  We discussed that there is a < 1/100 failure rate. 4.  I advised him to refrain from ejaculating for approximately 1 week. 5.  There is less than a 1% chance of developing a scrotal hematoma and equal chance of developing chronic scrotal pain. 6.   We will schedule his vasectomy at his convenience. 7.  He was given a prescription for Valium

## 2022-08-31 ENCOUNTER — Ambulatory Visit (INDEPENDENT_AMBULATORY_CARE_PROVIDER_SITE_OTHER): Payer: BC Managed Care – PPO | Admitting: Urology

## 2022-08-31 ENCOUNTER — Encounter: Payer: Self-pay | Admitting: Urology

## 2022-08-31 VITALS — BP 120/81 | HR 101

## 2022-08-31 DIAGNOSIS — Z3009 Encounter for other general counseling and advice on contraception: Secondary | ICD-10-CM

## 2022-08-31 DIAGNOSIS — Z302 Encounter for sterilization: Secondary | ICD-10-CM

## 2022-08-31 NOTE — Patient Instructions (Signed)
Vasectomy Postoperative Instructions ? ?Please bring back a semen analysis in approximately 3 months.  ?Your semen analysis  will need to be taken to  ?[facility]  [address], [place], [place] [address] ?[phone] ? ? You will be given a sterile specimen cup. Please label the cup with your name, date of birth, date and time of collections.  ?What to Expect ? - slight redness, swelling and scant drainage along the incision ? - mild to moderate discomfort ? - black and blue (bruising) as the tissue heals ? - low grade fever ? - scrotal sensitivity and/or tenderness ?- Edges of the incision may pull apart and heal slowly, sometimes a knot may be present which remains for several months.  This is NORMAL and all part of the healing process. ?- if stitches are placed, they do not need to be removed ?- if you have pain or discomfort immediately after the vasectomy, you may use OTC pain medication for relief , ex: tylenol.  After local anesthetic wears off an ice pack will provide additional comfort and can also prevent swelling if used ? ?Activity ? - no sexual intercourse for at lease 5 days depending on comfort ? - no heavy lifting for 48-72 hours (anything over 5-10 lbs) ? ?Wound Care ? - shower only after 24 hours ? - no tub baths, hot tub, or pools for at least 7 days ? - ice packs for 48 hours: 30 minutes on and 30 minutes off ? ?Problem to Report ? - generalized redness ? - increased pain and swelling ? - fever greater than 101 F ? - significant drainage or bleeding from the wound ? ?TO DO ?- Ejaculations help to clear the passage of sperm, but you must use another from of birth control until you are told you may discontinue its use!! ?- You will be given a specimen cup to bring back a semen sample in 3 months to check and see if its clear of sperm.  Only after the semen is sent for analysis and is reported back as clear should you use this as your primary form of birth control!    ?

## 2022-08-31 NOTE — Progress Notes (Signed)
The patient has reviewed information regarding the risks and complications and understands the advantages and disadvantages of elective sterilization. With full informed written and oral consent, he has elected to proceed with vasectomy. The patient was placed in the supine position, and the genitalia was sterilely prepped with betadine and draped; 1% lidocaine was used for local anesthesia of the skin and perivasal tissue in the midline. The right vas was then grasped through the skin using the non-piercing vas clamp, the skin was pierced with a single blade of the sharpened hemostat. The sharpened hemostat was then placed through this incision, and the perivasal tissue cleared from around the vas itself. The vas was then delivered through the wound and clamped with a vas clamp, a segment of approximately 1 cm of vasal tissue was cleared, and a 2-0 chromic suture was then passed beneath the vas. The chromic suture was then tied proximally, and a second one was tied distally, isolating the approximately 1 cm segment of vas. I then excised the segment of vas and cauterized the proximal and distal lumens with the eye cautery. The excess suture was then cut, and the ligated ends were covered with dartos fascia and buried, and the vas was allowed to drop back into the scrotum after observation for any bleeding. The contralateral vas was then treated in an identical fashion. Any further bleeding points were cauterized. The wound was then observed for any bleeding, the skin edges compressed, and none was noted. The patient tolerated the procedure well and without complications.   A sterile gauze dressing was applied as well as a form of scrotal supporting clothing. He was sent home with instructions regarding wound care and signs of infection, and recommended to restrict activity for 48 hours. .  He was instructed to use ice for the next 12 hours.  He was also instructed to call immediately for excessive bleeding,  swelling, drainage, discomfort, fever or chills. He was informed he should not consider himself sterile until a  semen analysis at 12 weeks has  been noted to be completely free of sperm.  

## 2022-11-19 ENCOUNTER — Telehealth: Payer: Self-pay

## 2022-11-19 NOTE — Telephone Encounter (Signed)
Patient was scheduled for a post vasectomy semen analysis on Tuesday 6/11. He will be out of town and wanted to reschedule. Your next available will be in August. He wanted to know if there was any way he could be worked in prior to that.     Thank you

## 2022-11-23 ENCOUNTER — Ambulatory Visit: Payer: BC Managed Care – PPO | Admitting: Urology

## 2022-11-24 ENCOUNTER — Other Ambulatory Visit: Payer: BC Managed Care – PPO

## 2022-12-07 ENCOUNTER — Other Ambulatory Visit: Payer: BC Managed Care – PPO

## 2022-12-07 ENCOUNTER — Telehealth: Payer: Self-pay

## 2022-12-07 NOTE — Telephone Encounter (Signed)
Patient is made aware of Dr. Dahlstedt recommendation and voiced understanding. 

## 2022-12-07 NOTE — Telephone Encounter (Signed)
-----   Message from Marcine Matar, MD sent at 12/07/2022  8:53 AM EDT ----- Regarding: RE: semen analysis Please call  pt--good news no sperm seen. OK to d/c [facility] ----- Message ----- From: Troy Sine, CMA Sent: 12/07/2022   8:47 AM EDT To: Marcine Matar, MD Subject: semen analysis                                 Patient drop off semen analysis. Please advised

## 2022-12-07 NOTE — Telephone Encounter (Signed)
Tried calling patient with no answer left vm for return call.

## 2023-01-06 ENCOUNTER — Encounter: Payer: Self-pay | Admitting: Nurse Practitioner

## 2023-01-06 ENCOUNTER — Ambulatory Visit (INDEPENDENT_AMBULATORY_CARE_PROVIDER_SITE_OTHER): Payer: BC Managed Care – PPO | Admitting: Nurse Practitioner

## 2023-01-06 VITALS — BP 127/82 | HR 100 | Temp 98.2°F | Ht 69.0 in | Wt 186.0 lb

## 2023-01-06 DIAGNOSIS — Z0000 Encounter for general adult medical examination without abnormal findings: Secondary | ICD-10-CM | POA: Diagnosis not present

## 2023-01-06 DIAGNOSIS — Z0001 Encounter for general adult medical examination with abnormal findings: Secondary | ICD-10-CM

## 2023-01-06 DIAGNOSIS — N760 Acute vaginitis: Secondary | ICD-10-CM | POA: Insufficient documentation

## 2023-01-06 DIAGNOSIS — K219 Gastro-esophageal reflux disease without esophagitis: Secondary | ICD-10-CM | POA: Diagnosis not present

## 2023-01-06 DIAGNOSIS — Z125 Encounter for screening for malignant neoplasm of prostate: Secondary | ICD-10-CM | POA: Diagnosis not present

## 2023-01-06 MED ORDER — PANTOPRAZOLE SODIUM 20 MG PO TBEC
20.0000 mg | DELAYED_RELEASE_TABLET | Freq: Two times a day (BID) | ORAL | 1 refills | Status: DC
Start: 2023-01-06 — End: 2023-04-28

## 2023-01-06 NOTE — Progress Notes (Signed)
Complete physical exam  Patient: Marc Roy   DOB: 17-Apr-1986   37 y.o. Male  MRN: [id number]  Subjective:    Chief Complaint  Patient presents with   Annual Exam   Gastroesophageal Reflux   hard spots on testicles     Noticed hard spots on testicles about a month after vasectomy few months ago    Marc Roy is a 37 y.o. male who presents today for a complete physical exam.    He reports consuming a general diet. The patient does not participate in regular exercise at present. Works 2-jobs no time to wrokout  He generally feels fairly well. He reports sleeping well. He does have additional problems to discuss today. Concerns about acid reflux and having concerns his testicles post vasectomy  GERD: Has been going  on and off for 3 yrs and have been on Prilosec with minimal relieve. Dsicuus atrting pantoprazole, and need a avoid eatig 1-2 hrs before bed time and avoid citrus foods.  Concern about dry area on testicles post vasectomy,  last month. Denies testicular pain. He is instructed to make f/u with surgicals team  Afib: reporst an isolated episodes in 2021 and was clear by cardiologist  ETOH: 2-3 beers daily, started at 19 yrs  Deneis family history of ETOH abuse Denies the use of tobacco and illicit drugs Quit THC for 1 yrs   Most recent fall risk assessment:    01/06/2023    4:05 PM  Fall Risk   Falls in the past year? 0  Number falls in past yr: 0  Injury with Fall? 0  Risk for fall due to : No Fall Risks     Most recent depression screenings:    01/06/2023    3:56 PM 09/08/2021    4:14 PM  PHQ 2/9 Scores  PHQ - 2 Score 0 0  PHQ- 9 Score 0 1    Vision:Within last year, Dental: No current dental problems, STD: The patient denies history of sexually transmitted disease., and PSA: Agrees to PSA testing  Patient Active Problem List   Diagnosis Date Noted   BV (bacterial vaginosis) 01/06/2023   Routine medical exam 01/06/2023   Sleep apnea  03/04/2021   Constipation 09/18/2020   Cough 05/28/2020   Sinus pressure 05/28/2020   Frequency of urination 03/25/2020   Lower abdominal pain 03/25/2020   Gastroesophageal reflux disease without esophagitis 09/12/2019   Past Medical History:  Diagnosis Date   Allergic rhinitis    GERD (gastroesophageal reflux disease)    Past Surgical History:  Procedure Laterality Date   vastectomy     WISDOM TOOTH EXTRACTION     Social History   Tobacco Use   Smoking status: Former    Current packs/day: 0.00    Types: Cigarettes    Quit date: 2012    Years since quitting: 12.5   Smokeless tobacco: Never  Vaping Use   Vaping status: Never Used  Substance Use Topics   Alcohol use: Yes    Alcohol/week: 21.0 standard drinks of alcohol    Types: 21 Cans of beer per week    Comment: 09/18/20 "very little"   Drug use: Yes    Types: Marijuana   Social History   Socioeconomic History   Marital status: Single    Spouse name: Not on file   Number of children: Not on file   Years of education: Not on file   Highest education level: Not on file  Occupational History  Not on file  Tobacco Use   Smoking status: Former    Current packs/day: 0.00    Types: Cigarettes    Quit date: 2012    Years since quitting: 12.5   Smokeless tobacco: Never  Vaping Use   Vaping status: Never Used  Substance and Sexual Activity   Alcohol use: Yes    Alcohol/week: 21.0 standard drinks of alcohol    Types: 21 Cans of beer per week    Comment: 09/18/20 "very little"   Drug use: Yes    Types: Marijuana   Sexual activity: Yes    Birth control/protection: None  Other Topics Concern   Not on file  Social History Narrative   Not on file   Social Determinants of Health   Financial Resource Strain: Not on file  Food Insecurity: No Food Insecurity (01/06/2023)   Hunger Vital Sign    Worried About Running Out of Food in the Last Year: Never true    Ran Out of Food in the Last Year: Never true   Transportation Needs: No Transportation Needs (01/06/2023)   PRAPARE - Administrator, Civil Service (Medical): No    Lack of Transportation (Non-Medical): No  Physical Activity: Not on file  Stress: Not on file  Social Connections: Unknown (10/27/2021)   [facility]   Social Network    Social Network: Not on file  Intimate Partner Violence: Not At Risk (01/06/2023)   Humiliation, Afraid, Rape, and Kick questionnaire    Fear of Current or Ex-Partner: No    Emotionally Abused: No    Physically Abused: No    Sexually Abused: No   Family Status  Relation Name Status   Mother  Alive   Father  Alive   MGM  Alive   MGF  Deceased   PGM  Deceased   PGF  Deceased   Neg Hx  (Not Specified)  No partnership data on file   Family History  Problem Relation Age of Onset   Alzheimer's disease Maternal Grandmother    Alzheimer's disease Paternal Grandmother    Sleep apnea Neg Hx    Allergies  Allergen Reactions   Bee Venom Swelling   Amoxicillin    Penicillins       Patient Care Team: [facility] Santa Lighter, Dois Davenport, NP as PCP - General (Nurse Practitioner) Lanelle Bal, DO as Consulting Physician (Internal Medicine)   Outpatient Medications Prior to Visit  Medication Sig   [DISCONTINUED] omeprazole (PRILOSEC) 20 MG capsule Take 1 capsule (20 mg total) by mouth daily. (Patient taking differently: Take 20 mg by mouth as needed.)   [DISCONTINUED] diazepam (VALIUM) 10 MG tablet Take 1 tablet p.o. 2 hours before procedure   [DISCONTINUED] triamcinolone cream (KENALOG) 0.1 % Apply 1 application. topically 2 (two) times daily.   No facility-administered medications prior to visit.    Review of Systems  Constitutional:  Negative for chills, fever and weight loss.  Eyes:  Negative for double vision and redness.  Respiratory:  Negative for cough.   Gastrointestinal:  Positive for heartburn. Negative for abdominal pain, blood in stool, constipation, melena and  vomiting.  Genitourinary:  Negative for dysuria and hematuria.  Musculoskeletal:  Negative for back pain and myalgias.  Skin:  Negative for itching and rash.  Neurological:  Negative for dizziness, tremors, weakness and headaches.  Endo/Heme/Allergies:  Negative for polydipsia. Does not bruise/bleed easily.  Psychiatric/Behavioral:  Negative for hallucinations, substance abuse and suicidal ideas. The patient does not  have insomnia.        Quit smoking THC last April   Negative unless indicated in HPI    Objective:     BP 127/82   Pulse 100   Temp 98.2 F (36.8 C) (Temporal)   Ht 5\' 9"  (1.753 m)   Wt 186 lb (84.4 kg)   SpO2 96%   BMI 27.47 kg/m  BP Readings from Last 3 Encounters:  01/06/23 127/82  08/31/22 120/81  07/27/22 (!) 145/92   Wt Readings from Last 3 Encounters:  01/06/23 186 lb (84.4 kg)  09/08/21 160 lb (72.6 kg)  08/07/21 161 lb (73 kg)      Physical Exam Vitals and nursing note reviewed.  Constitutional:      Appearance: Normal appearance. He is overweight.  HENT:     Head: Normocephalic and atraumatic.  Eyes:     General: No scleral icterus.    Conjunctiva/sclera: Conjunctivae normal.     Pupils: Pupils are equal, round, and reactive to light.  Cardiovascular:     Rate and Rhythm: Normal rate and regular rhythm.  Pulmonary:     Effort: Pulmonary effort is normal.     Breath sounds: Normal breath sounds. No wheezing.  Abdominal:     General: Bowel sounds are normal.     Palpations: Abdomen is soft.     Tenderness: There is no abdominal tenderness. There is no right CVA tenderness, left CVA tenderness, guarding or rebound.     Hernia: No hernia is present.  Genitourinary:    Comments: Not assessed Musculoskeletal:     Cervical back: Normal range of motion and neck supple. No rigidity or tenderness.  Skin:    General: Skin is warm and dry.     Coloration: Skin is not jaundiced.     Findings: No rash.  Neurological:     General: No focal  deficit present.     Mental Status: He is alert. Mental status is at baseline. He is disoriented.  Psychiatric:        Mood and Affect: Mood normal.        Behavior: Behavior normal.        Thought Content: Thought content normal.        Judgment: Judgment normal.      No results found for any visits on 01/06/23. Last CBC Lab Results  Component Value Date   WBC 6.8 09/06/2019   HGB 16.1 09/06/2019   HCT 47.1 09/06/2019   MCV 91 09/06/2019   MCH 31.0 09/06/2019   RDW 12.0 09/06/2019   PLT 254 09/06/2019   Last metabolic panel Lab Results  Component Value Date   GLUCOSE 92 09/06/2019   NA 143 09/06/2019   K 3.9 09/06/2019   CL 103 09/06/2019   CO2 26 09/06/2019   BUN 19 09/06/2019   CREATININE 0.91 09/06/2019   GFRNONAA 112 09/06/2019   CALCIUM 9.4 09/06/2019   PROT 7.2 09/06/2019   ALBUMIN 4.9 09/06/2019   LABGLOB 2.3 09/06/2019   AGRATIO 2.1 09/06/2019   BILITOT 0.5 09/06/2019   ALKPHOS 50 09/06/2019   AST 34 09/06/2019   ALT 32 09/06/2019   Last lipids Lab Results  Component Value Date   CHOL 157 09/12/2019   HDL 61 09/12/2019   LDLCALC 74 09/12/2019   TRIG 124 09/12/2019   CHOLHDL 2.6 09/12/2019        Assessment & Plan:    Routine Health Maintenance and Physical Exam  Discussed health benefits of physical activity,  and encouraged him to engage in regular exercise appropriate for his age and condition.  Routine medical exam -     CBC with Differential/Platelet -     CMP14+EGFR -     Lipid panel -     Thyroid Panel With TSH -     Bayer DCA Hb A1c Waived  Gastroesophageal reflux disease without esophagitis -     CBC with Differential/Platelet -     Pantoprazole Sodium; Take 1 tablet (20 mg total) by mouth 2 (two) times daily.  Dispense: 180 tablet; Refill: 1  Screening PSA (prostate specific antigen) -     PSA, total and free    Teondre seen today for physical, no acute distress - Labs: CBC, CMP, Lipid, TSH, A1c GERD: d/c Prilosec, start  pantoprazole 20 mg BID - ned to f/u with surgicals team for concerns sa bout testicles  Return in about 6 months (around 07/09/2023) for follow-up GERD.    Continue healthy lifestyle choices, including diet (rich in fruits, vegetables, and lean proteins, and low in salt and simple carbohydrates) and exercise (at least 30 minutes of moderate physical activity daily).     The above assessment and management plan was discussed with the patient. The patient verbalized understanding of and has agreed to the management plan. Patient is aware to call the clinic if they develop any new symptoms or if symptoms persist or worsen. Patient is aware when to return to the clinic for a follow-up visit. Patient educated on when it is appropriate to go to the emergency department.    Arrie Aran Santa Lighter, DNP [facility] [address] [place], [place] [address] [phone]

## 2023-01-07 ENCOUNTER — Telehealth: Payer: Self-pay

## 2023-01-07 LAB — PSA, TOTAL AND FREE: PSA, Free Pct: 48.9 %

## 2023-01-07 LAB — BAYER DCA HB A1C WAIVED: HB A1C (BAYER DCA - WAIVED): 5.3 % (ref 4.8–5.6)

## 2023-01-07 NOTE — Telephone Encounter (Signed)
Marc Roy (Key: [id number]) Rx #: [id number] Need Help? Call us at [phone] Status sent iconSent to Plan today Drug Pantoprazole Sodium 20MG  dr tablets ePA cloud logo Form [employer] [place] [employer] Electronic Request Form Original Claim Info 75 DRUG REQUIRES PRIOR AUTHORIZATION

## 2023-01-10 ENCOUNTER — Other Ambulatory Visit (HOSPITAL_COMMUNITY): Payer: Self-pay

## 2023-01-10 NOTE — Telephone Encounter (Signed)
Pharmacy Patient Advocate Encounter  Received notification from [facility] that Prior Authorization for Pantoprazole Sodium 20MG  dr tablets has been APPROVED from 01/07/23 to 01/07/24. Ran test claim, Copay is $7.81.  PA #/Case ID/Reference #: [id number]

## 2023-02-04 ENCOUNTER — Telehealth: Payer: Self-pay

## 2023-02-04 NOTE — Telephone Encounter (Addendum)
Return call and left vm that he is having some tenderness after having his vasectomy and he is also concern about hard lump on both testicles. Patient states he would like to have an appointment for Dr. Retta Diones to exam his testicles to make sure there is not to worried about. Patient is aware a message will be sent to Dr. Retta Diones on recommendation and someone will follow up let the patient know Dr. Retta Diones recommendation. Patient voiced understanding

## 2023-02-08 NOTE — Telephone Encounter (Signed)
Have him come in tomorrow to see me. Things like this can be sent to Sarah to see as well. Patient is scheduled and voiced understanding

## 2023-02-28 ENCOUNTER — Encounter: Payer: Self-pay | Admitting: Urology

## 2023-02-28 ENCOUNTER — Ambulatory Visit (INDEPENDENT_AMBULATORY_CARE_PROVIDER_SITE_OTHER): Payer: BC Managed Care – PPO | Admitting: Urology

## 2023-02-28 VITALS — BP 143/96 | HR 118

## 2023-02-28 DIAGNOSIS — N50812 Left testicular pain: Secondary | ICD-10-CM | POA: Diagnosis not present

## 2023-02-28 DIAGNOSIS — N503 Cyst of epididymis: Secondary | ICD-10-CM

## 2023-02-28 NOTE — Progress Notes (Signed)
History of Present Illness: Marc Roy had a vasectomy ~ 6 mos ago. He had a bit more discomfort than he expected following, as well as a bit of ecchymosis.   He has always had a sensitive Lt testicle. He has felt small lumps associated with both testicles since the procedure. No real tenderness with them, and they have not grown.  Past Medical History:  Diagnosis Date   Allergic rhinitis    GERD (gastroesophageal reflux disease)     Past Surgical History:  Procedure Laterality Date   vastectomy     WISDOM TOOTH EXTRACTION      Home Medications:  Allergies as of 02/28/2023       Reactions   Bee Venom Swelling   Amoxicillin    Penicillins         Medication List        Accurate as of February 28, 2023  7:53 AM. If you have any questions, ask your nurse or doctor.          pantoprazole 20 MG tablet Commonly known as: PROTONIX Take 1 tablet (20 mg total) by mouth 2 (two) times daily.        Allergies:  Allergies  Allergen Reactions   Bee Venom Swelling   Amoxicillin    Penicillins     Family History  Problem Relation Age of Onset   Alzheimer's disease Maternal Grandmother    Alzheimer's disease Paternal Grandmother    Sleep apnea Neg Hx     Social History:  reports that he quit smoking about 12 years ago. His smoking use included cigarettes. He has never used smokeless tobacco. He reports current alcohol use of about 21.0 standard drinks of alcohol per week. He reports current drug use. Drug: Marijuana.  ROS: A complete review of systems was performed.  All systems are negative except for pertinent findings as noted.  Physical Exam:  Vital signs in last 24 hours: There were no vitals taken for this visit. Constitutional:  Alert and oriented, No acute distress Cardiovascular: Regular rate  Respiratory: Normal respiratory effor Genitourinary: Normal male phallus, testes are descended bilaterally and non-tender and without masses, scrotum is normal in  appearance without lesions or masses, perineum is normal on inspection. Small (B) epididymal cysts. No testicular masses. Lymphatic: No lymphadenopathy Neurologic: Grossly intact, no focal deficits Psychiatric: Normal mood and affect  I have reviewed prior pt notes  I have reviewed semenalysis results--no  sperm seen     Impression/Assessment:  Normal post vasectomy exam  Small (B)  epididymal cysts  Chronic Lt testicular pain--mild  Plan:  Reassurance regarding exam  OV prn

## 2023-04-27 NOTE — Progress Notes (Unsigned)
Established Patient Office Visit  Subjective   Patient ID: Marc Roy, male    DOB: [birth year]  Age: 37 y.o. MRN: [id number]  No chief complaint on file.  Reports dealing with lightlessness 53-months " it has not been bad as far as tedix=zzy spell, has ben better" HPI Marc Roy 37 year old male presented to the ER April 28, 2023 complaining of dizziness.  Reported on and off issue with dizziness for 3 months now.  Client reports consuming a lot of caffeine and alcohol on a daily basis.  EKG at bedside normal sinus rhythm Endorse consuming  ETOH dialy  6-8 beers ith mixed drinks and started at 24 yrs old.  Client and girlfriend reports that he does not think likely having issue with alcohol though he has been consuming 6-8 drink daily.  "I have my alcohol under control and I can stop whenever I want" explained to client that dizziness is mostly associated to dehydration due to high intake of caffeine and alcohol.  Will order BMP and thiamine Former smoker quit 10 yrs  Former THC, last use 1-yr ago  Patient Active Problem List   Diagnosis Date Noted   BV (bacterial vaginosis) 01/06/2023   Routine medical exam 01/06/2023   Sleep apnea 03/04/2021   Constipation 09/18/2020   Cough 05/28/2020   Sinus pressure 05/28/2020   Frequency of urination 03/25/2020   Lower abdominal pain 03/25/2020   Gastroesophageal reflux disease without esophagitis 09/12/2019   Past Medical History:  Diagnosis Date   Allergic rhinitis    GERD (gastroesophageal reflux disease)    Past Surgical History:  Procedure Laterality Date   vastectomy     WISDOM TOOTH EXTRACTION     Social History   Tobacco Use   Smoking status: Former    Current packs/day: 0.00    Types: Cigarettes    Quit date: 2012    Years since quitting: 12.8   Smokeless tobacco: Never  Vaping Use   Vaping status: Never Used  Substance Use Topics   Alcohol use: Yes    Alcohol/week: 21.0 standard drinks of alcohol     Types: 21 Cans of beer per week    Comment: 09/18/20 "very little"   Drug use: Yes    Types: Marijuana   Social History   Socioeconomic History   Marital status: Single    Spouse name: Not on file   Number of children: Not on file   Years of education: Not on file   Highest education level: Not on file  Occupational History   Not on file  Tobacco Use   Smoking status: Former    Current packs/day: 0.00    Types: Cigarettes    Quit date: 2012    Years since quitting: 12.8   Smokeless tobacco: Never  Vaping Use   Vaping status: Never Used  Substance and Sexual Activity   Alcohol use: Yes    Alcohol/week: 21.0 standard drinks of alcohol    Types: 21 Cans of beer per week    Comment: 09/18/20 "very little"   Drug use: Yes    Types: Marijuana   Sexual activity: Yes    Birth control/protection: None  Other Topics Concern   Not on file  Social History Narrative   Not on file   Social Determinants of Health   Financial Resource Strain: Not on file  Food Insecurity: No Food Insecurity (01/06/2023)   Hunger Vital Sign    Worried About Running Out of Food in the  Last Year: Never true    Ran Out of Food in the Last Year: Never true  Transportation Needs: No Transportation Needs (01/06/2023)   PRAPARE - Administrator, Civil Service (Medical): No    Lack of Transportation (Non-Medical): No  Physical Activity: Not on file  Stress: Not on file  Social Connections: Unknown (10/27/2021)   Received from [facility], [facility]   Social Network    Social Network: Not on file  Intimate Partner Violence: Not At Risk (01/06/2023)   Humiliation, Afraid, Rape, and Kick questionnaire    Fear of Current or Ex-Partner: No    Emotionally Abused: No    Physically Abused: No    Sexually Abused: No   Family Status  Relation Name Status   Mother  Alive   Father  Alive   MGM  Alive   MGF  Deceased   PGM  Deceased   PGF  Deceased   Neg Hx  (Not Specified)  No partnership  data on file   Family History  Problem Relation Age of Onset   Alzheimer's disease Maternal Grandmother    Alzheimer's disease Paternal Grandmother    Sleep apnea Neg Hx    Allergies  Allergen Reactions   Bee Venom Swelling   Amoxicillin    Penicillins       Review of Systems  Constitutional:  Negative for chills and fever.  HENT:  Negative for congestion and sore throat.   Eyes:  Negative for pain and discharge.  Respiratory:  Negative for cough and shortness of breath.   Cardiovascular:  Negative for chest pain and leg swelling.       Dizziness  Gastrointestinal:  Negative for blood in stool, melena, nausea and vomiting.  Genitourinary:  Negative for frequency, hematuria and urgency.  Musculoskeletal:  Negative for falls and myalgias.  Skin:  Negative for itching and rash.  Neurological:  Positive for tremors. Negative for dizziness and headaches.  Endo/Heme/Allergies:  Negative for environmental allergies and polydipsia. Does not bruise/bleed easily.  Psychiatric/Behavioral:  Negative for suicidal ideas. The patient does not have insomnia.    Negative unless indicated in HPI   Objective:     There were no vitals taken for this visit. BP Readings from Last 3 Encounters:  02/28/23 (!) 143/96  01/06/23 127/82  08/31/22 120/81   Wt Readings from Last 3 Encounters:  01/06/23 186 lb (84.4 kg)  09/08/21 160 lb (72.6 kg)  08/07/21 161 lb (73 kg)      Physical Exam Vitals and nursing note reviewed.  Constitutional:      Appearance: Normal appearance.  HENT:     Head: Normocephalic and atraumatic.  Eyes:     General: No scleral icterus.    Extraocular Movements: Extraocular movements intact.     Conjunctiva/sclera: Conjunctivae normal.     Pupils: Pupils are equal, round, and reactive to light.  Cardiovascular:     Pulses: Normal pulses.     Heart sounds: Normal heart sounds.  Pulmonary:     Effort: Pulmonary effort is normal.     Breath sounds: Normal breath  sounds.  Abdominal:     General: Bowel sounds are normal.     Palpations: Abdomen is soft.  Musculoskeletal:        General: Normal range of motion.     Cervical back: Normal range of motion and neck supple.     Right lower leg: No edema.     Left lower leg: No edema.  Skin:    General: Skin is warm and dry.     Findings: No rash.  Neurological:     Mental Status: He is alert and oriented to person, place, and time. Mental status is at baseline.  Psychiatric:        Mood and Affect: Mood normal.        Behavior: Behavior normal.        Thought Content: Thought content normal.        Judgment: Judgment normal.      No results found for any visits on 04/28/23.  Last CBC Lab Results  Component Value Date   WBC 6.4 01/06/2023   HGB 15.9 01/06/2023   HCT 45.5 01/06/2023   MCV 87 01/06/2023   MCH 30.3 01/06/2023   RDW 12.4 01/06/2023   PLT 265 01/06/2023   Last metabolic panel Lab Results  Component Value Date   GLUCOSE 103 (H) 01/06/2023   NA 141 01/06/2023   K 3.8 01/06/2023   CL 100 01/06/2023   CO2 25 01/06/2023   BUN 15 01/06/2023   CREATININE 0.97 01/06/2023   EGFR 105 01/06/2023   CALCIUM 9.9 01/06/2023   PROT 7.3 01/06/2023   ALBUMIN 4.8 01/06/2023   LABGLOB 2.5 01/06/2023   AGRATIO 2.1 09/06/2019   BILITOT 0.4 01/06/2023   ALKPHOS 60 01/06/2023   AST 31 01/06/2023   ALT 71 (H) 01/06/2023   Last lipids Lab Results  Component Value Date   CHOL 192 01/06/2023   HDL 55 01/06/2023   LDLCALC 106 (H) 01/06/2023   TRIG 177 (H) 01/06/2023   CHOLHDL 3.5 01/06/2023   Last hemoglobin A1c Lab Results  Component Value Date   HGBA1C 5.3 01/06/2023   Last thyroid functions Lab Results  Component Value Date   TSH 1.780 01/06/2023   T4TOTAL 7.7 01/06/2023        Assessment & Plan:  There are no diagnoses linked to this encounter. Shakeem 37 year old Caucasian male, no acute distress Dizziness: Order BMP to rule out dehydration EtOH abuse will  order thiamine, BMP advised fasting client to decrease alcohol and caffeine intake.  Advised on attending AA Labs: BMP and thiamine  EKG normal sinus rhythm GERD pantoprazole refill All question answereds Encourage healthy lifestyle choices, including diet (rich in fruits, vegetables, and lean proteins, and low in salt and simple carbohydrates) and exercise (at least 30 minutes of moderate physical activity daily).     The above assessment and management plan was discussed with the patient. The patient verbalized understanding of and has agreed to the management plan. Patient is aware to call the clinic if they develop any new symptoms or if symptoms persist or worsen. Patient is aware when to return to the clinic for a follow-up visit. Patient educated on when it is appropriate to go to the emergency department.  No follow-ups on file.    Arrie Aran Santa Lighter, DNP [facility] [address] [place], [place] [address] [phone]

## 2023-04-28 ENCOUNTER — Encounter: Payer: Self-pay | Admitting: Nurse Practitioner

## 2023-04-28 ENCOUNTER — Ambulatory Visit: Payer: BC Managed Care – PPO | Admitting: Nurse Practitioner

## 2023-04-28 VITALS — BP 125/83 | HR 95 | Temp 97.6°F | Ht 69.0 in | Wt 189.2 lb

## 2023-04-28 DIAGNOSIS — R42 Dizziness and giddiness: Secondary | ICD-10-CM | POA: Diagnosis not present

## 2023-04-28 DIAGNOSIS — K2100 Gastro-esophageal reflux disease with esophagitis, without bleeding: Secondary | ICD-10-CM | POA: Insufficient documentation

## 2023-04-28 DIAGNOSIS — Z0001 Encounter for general adult medical examination with abnormal findings: Secondary | ICD-10-CM | POA: Insufficient documentation

## 2023-04-28 DIAGNOSIS — F1010 Alcohol abuse, uncomplicated: Secondary | ICD-10-CM | POA: Diagnosis not present

## 2023-04-28 DIAGNOSIS — K219 Gastro-esophageal reflux disease without esophagitis: Secondary | ICD-10-CM

## 2023-04-28 MED ORDER — PANTOPRAZOLE SODIUM 20 MG PO TBEC
20.0000 mg | DELAYED_RELEASE_TABLET | Freq: Two times a day (BID) | ORAL | 0 refills | Status: DC
Start: 2023-04-28 — End: 2023-09-06

## 2023-05-02 LAB — BMP8+EGFR
BUN/Creatinine Ratio: 18 (ref 9–20)
BUN: 18 mg/dL (ref 6–20)
CO2: 25 mmol/L (ref 20–29)
Calcium: 9.9 mg/dL (ref 8.7–10.2)
Chloride: 102 mmol/L (ref 96–106)
Creatinine, Ser: 1.01 mg/dL (ref 0.76–1.27)
Glucose: 94 mg/dL (ref 70–99)
Potassium: 3.9 mmol/L (ref 3.5–5.2)
Sodium: 142 mmol/L (ref 134–144)
eGFR: 99 mL/min/{1.73_m2} (ref >59)

## 2023-05-02 LAB — VITAMIN B1: Thiamine: 134.7 nmol/L (ref 66.5–200.0)

## 2023-06-01 ENCOUNTER — Other Ambulatory Visit: Payer: Self-pay | Admitting: Medical Genetics

## 2023-06-10 ENCOUNTER — Other Ambulatory Visit (HOSPITAL_COMMUNITY): Payer: Self-pay

## 2023-06-16 ENCOUNTER — Other Ambulatory Visit (HOSPITAL_COMMUNITY)
Admission: RE | Admit: 2023-06-16 | Discharge: 2023-06-16 | Disposition: A | Discharge status: Home / Self Care | Payer: Self-pay | Source: Ambulatory Visit | Attending: Oncology | Admitting: Oncology

## 2023-07-03 NOTE — Progress Notes (Deleted)
Established Patient Office Visit  Subjective  Patient ID: Marc Roy, male    DOB: Dec 12, 1985  Age: 38 y.o. MRN: [id number]  No chief complaint on file.   HPI  Patient Active Problem List   Diagnosis Date Noted   Gastroesophageal reflux disease with esophagitis without hemorrhage 04/28/2023   Dizziness 04/28/2023   Alcohol abuse 04/28/2023   Encounter for general adult medical examination with abnormal findings 04/28/2023   BV (bacterial vaginosis) 01/06/2023   Routine medical exam 01/06/2023   Sleep apnea 03/04/2021   Constipation 09/18/2020   Cough 05/28/2020   Sinus pressure 05/28/2020   Frequency of urination 03/25/2020   Lower abdominal pain 03/25/2020   Gastroesophageal reflux disease without esophagitis 09/12/2019   Past Medical History:  Diagnosis Date   Allergic rhinitis    GERD (gastroesophageal reflux disease)    Past Surgical History:  Procedure Laterality Date   vastectomy     WISDOM TOOTH EXTRACTION     Social History   Tobacco Use   Smoking status: Former    Current packs/day: 0.00    Types: Cigarettes    Quit date: 2012    Years since quitting: 13.0   Smokeless tobacco: Never  Vaping Use   Vaping status: Never Used  Substance Use Topics   Alcohol use: Yes    Alcohol/week: 21.0 standard drinks of alcohol    Types: 21 Cans of beer per week    Comment: 09/18/20 "very little"   Drug use: Yes    Types: Marijuana   Social History   Socioeconomic History   Marital status: Single    Spouse name: Not on file   Number of children: Not on file   Years of education: Not on file   Highest education level: Not on file  Occupational History   Not on file  Tobacco Use   Smoking status: Former    Current packs/day: 0.00    Types: Cigarettes    Quit date: 2012    Years since quitting: 13.0   Smokeless tobacco: Never  Vaping Use   Vaping status: Never Used  Substance and Sexual Activity   Alcohol use: Yes    Alcohol/week: 21.0 standard  drinks of alcohol    Types: 21 Cans of beer per week    Comment: 09/18/20 "very little"   Drug use: Yes    Types: Marijuana   Sexual activity: Yes    Birth control/protection: None  Other Topics Concern   Not on file  Social History Narrative   Not on file   Social Drivers of Health   Financial Resource Strain: Not on file  Food Insecurity: No Food Insecurity (01/06/2023)   Hunger Vital Sign    Worried About Running Out of Food in the Last Year: Never true    Ran Out of Food in the Last Year: Never true  Transportation Needs: No Transportation Needs (01/06/2023)   PRAPARE - Administrator, Civil Service (Medical): No    Lack of Transportation (Non-Medical): No  Physical Activity: Not on file  Stress: Not on file  Social Connections: Unknown (10/27/2021)   [facility], [facility]   Social Network    Social Network: Not on file  Intimate Partner Violence: Not At Risk (01/06/2023)   Humiliation, Afraid, Rape, and Kick questionnaire    Fear of Current or Ex-Partner: No    Emotionally Abused: No    Physically Abused: No    Sexually Abused: No  Family Status  Relation Name Status   Mother  Alive   Father  Alive   MGM  Alive   MGF  Deceased   PGM  Deceased   PGF  Deceased   Neg Hx  (Not Specified)  No partnership data on file   Family History  Problem Relation Age of Onset   Alzheimer's disease Maternal Grandmother    Alzheimer's disease Paternal Grandmother    Sleep apnea Neg Hx    Allergies  Allergen Reactions   Bee Venom Swelling   Amoxicillin    Penicillins       ROS Negative unless indicated in HPI   Objective:     There were no vitals taken for this visit. BP Readings from Last 3 Encounters:  04/28/23 125/83  02/28/23 (!) 143/96  01/06/23 127/82   Wt Readings from Last 3 Encounters:  04/28/23 189 lb 3.2 oz (85.8 kg)  01/06/23 186 lb (84.4 kg)  09/08/21 160 lb (72.6 kg)      Physical Exam   No results found for  any visits on 07/07/23.  Last CBC Lab Results  Component Value Date   WBC 6.4 01/06/2023   HGB 15.9 01/06/2023   HCT 45.5 01/06/2023   MCV 87 01/06/2023   MCH 30.3 01/06/2023   RDW 12.4 01/06/2023   PLT 265 01/06/2023   Last metabolic panel Lab Results  Component Value Date   GLUCOSE 94 04/28/2023   NA 142 04/28/2023   K 3.9 04/28/2023   CL 102 04/28/2023   CO2 25 04/28/2023   BUN 18 04/28/2023   CREATININE 1.01 04/28/2023   EGFR 99 04/28/2023   CALCIUM 9.9 04/28/2023   PROT 7.3 01/06/2023   ALBUMIN 4.8 01/06/2023   LABGLOB 2.5 01/06/2023   AGRATIO 2.1 09/06/2019   BILITOT 0.4 01/06/2023   ALKPHOS 60 01/06/2023   AST 31 01/06/2023   ALT 71 (H) 01/06/2023   Last lipids Lab Results  Component Value Date   CHOL 192 01/06/2023   HDL 55 01/06/2023   LDLCALC 106 (H) 01/06/2023   TRIG 177 (H) 01/06/2023   CHOLHDL 3.5 01/06/2023   Last hemoglobin A1c Lab Results  Component Value Date   HGBA1C 5.3 01/06/2023   Last thyroid functions Lab Results  Component Value Date   TSH 1.780 01/06/2023   T4TOTAL 7.7 01/06/2023        Assessment & Plan:  There are no diagnoses linked to this encounter. Continue healthy lifestyle choices, including diet (rich in fruits, vegetables, and lean proteins, and low in salt and simple carbohydrates) and exercise (at least 30 minutes of moderate physical activity daily).     The above assessment and management plan was discussed with the patient. The patient verbalized understanding of and has agreed to the management plan. Patient is aware to call the clinic if they develop any new symptoms or if symptoms persist or worsen. Patient is aware when to return to the clinic for a follow-up visit. Patient educated on when it is appropriate to go to the emergency department.  No follow-ups on file.    Arrie Aran Santa Lighter, [place] [facility] [address] [place], [place] [address] [phone]    Note:  This document was prepared by [employer] voice dictation technology and any errors that results from this process are unintentional.

## 2023-07-04 LAB — GENECONNECT MOLECULAR SCREEN: Genetic Analysis Overall Interpretation: NEGATIVE

## 2023-07-07 ENCOUNTER — Ambulatory Visit: Payer: BC Managed Care – PPO | Admitting: Nurse Practitioner

## 2023-07-14 ENCOUNTER — Telehealth: Payer: BC Managed Care – PPO | Admitting: Physician Assistant

## 2023-07-14 DIAGNOSIS — J111 Influenza due to unidentified influenza virus with other respiratory manifestations: Secondary | ICD-10-CM

## 2023-07-15 MED ORDER — OSELTAMIVIR PHOSPHATE 75 MG PO CAPS: 75.0000 mg | ORAL_CAPSULE | Freq: Two times a day (BID) | ORAL | 0 refills | Status: DC

## 2023-07-15 NOTE — Progress Notes (Signed)
 E visit for Flu like symptoms   We are sorry that you are not feeling well.  Here is how we plan to help! Based on what you have shared with me it looks like you may have a respiratory virus that may be influenza.  Influenza or "the flu" is   an infection caused by a respiratory virus. The flu virus is highly contagious and persons who did not receive their yearly flu vaccination may "catch" the flu from close contact.  We have anti-viral medications to treat the viruses that cause this infection. They are not a "cure" and only shorten the course of the infection. These prescriptions are most effective when they are given within the first 2 days of "flu" symptoms. Antiviral medication are indicated if you have a high risk of complications from the flu. You should  also consider an antiviral medication if you are in close contact with someone who is at risk. These medications can help patients avoid complications from the flu  but have side effects that you should know. Possible side effects from Tamiflu or oseltamivir include nausea, vomiting, diarrhea, dizziness, headaches, eye redness, sleep problems or other respiratory symptoms. You should not take Tamiflu if you have an allergy to oseltamivir or any to the ingredients in Tamiflu.  Based upon your symptoms and potential risk factors I have prescribed Oseltamivir (Tamiflu).  It has been sent to your designated pharmacy.  You will take one 75 mg capsule orally twice a day for the next 5 days.  ANYONE WHO HAS FLU SYMPTOMS SHOULD: Stay home. The flu is highly contagious and going out or to work exposes others! Be sure to drink plenty of fluids. Water is fine as well as fruit juices, sodas and electrolyte beverages. You may want to stay away from caffeine or alcohol. If you are nauseated, try taking small sips of liquids. How do you know if you are getting enough fluid? Your urine should be a pale yellow or almost colorless. Get rest. Taking a steamy  shower or using a humidifier may help nasal congestion and ease sore throat pain. Using a saline nasal spray works much the same way. Cough drops, hard candies and sore throat lozenges may ease your cough. Line up a caregiver. Have someone check on you regularly.   GET HELP RIGHT AWAY IF: You cannot keep down liquids or your medications. You become short of breath Your fell like you are going to pass out or loose consciousness. Your symptoms persist after you have completed your treatment plan MAKE SURE YOU  Understand these instructions. Will watch your condition. Will get help right away if you are not doing well or get worse.  Your e-visit answers were reviewed by a board certified advanced clinical practitioner to complete your personal care plan.  Depending on the condition, your plan could have included both over the counter or prescription medications.  If there is a problem please reply  once you have received a response from your provider.  Your safety is important to [place].  If you have drug allergies check your prescription carefully.    You can use MyChart to ask questions about today's visit, request a non-urgent call back, or ask for a work or school excuse for 24 hours related to this e-Visit. If it has been greater than 24 hours you will need to follow up with your provider, or enter a new e-Visit to address those concerns.  You will get an e-mail in the next  two days asking about your experience.  I hope that your e-visit has been valuable and will speed your recovery. Thank you for using e-visits.  I have spent 5 minutes in review of e-visit questionnaire, review and updating patient chart, medical decision making and response to patient.   Margaretann Loveless, PA-C

## 2023-09-06 ENCOUNTER — Other Ambulatory Visit: Payer: Self-pay | Admitting: Nurse Practitioner

## 2023-09-06 DIAGNOSIS — K219 Gastro-esophageal reflux disease without esophagitis: Secondary | ICD-10-CM

## 2023-09-06 DIAGNOSIS — K2100 Gastro-esophageal reflux disease with esophagitis, without bleeding: Secondary | ICD-10-CM

## 2023-09-28 ENCOUNTER — Telehealth: Admitting: Physician Assistant

## 2023-09-28 DIAGNOSIS — B9689 Other specified bacterial agents as the cause of diseases classified elsewhere: Secondary | ICD-10-CM

## 2023-09-28 DIAGNOSIS — J019 Acute sinusitis, unspecified: Secondary | ICD-10-CM

## 2023-09-29 MED ORDER — DOXYCYCLINE HYCLATE 100 MG PO TABS
100.0000 mg | ORAL_TABLET | Freq: Two times a day (BID) | ORAL | 0 refills | Status: DC
Start: 2023-09-29 — End: 2024-04-27

## 2023-09-29 NOTE — Progress Notes (Signed)

## 2023-09-29 NOTE — Progress Notes (Signed)
 I have spent 5 minutes in review of e-visit questionnaire, review and updating patient chart, medical decision making and response to patient.   Piedad Climes, PA-C

## 2023-10-27 ENCOUNTER — Other Ambulatory Visit: Payer: Self-pay | Admitting: Nurse Practitioner

## 2023-10-27 DIAGNOSIS — K2100 Gastro-esophageal reflux disease with esophagitis, without bleeding: Secondary | ICD-10-CM

## 2023-10-27 DIAGNOSIS — K219 Gastro-esophageal reflux disease without esophagitis: Secondary | ICD-10-CM

## 2023-10-27 MED ORDER — PANTOPRAZOLE SODIUM 20 MG PO TBEC: 20.0000 mg | DELAYED_RELEASE_TABLET | Freq: Two times a day (BID) | ORAL | 0 refills | Status: DC

## 2023-10-27 NOTE — Telephone Encounter (Signed)
 Copied from CRM [id number]. Topic: Clinical - Medication Refill >> Oct 27, 2023  1:29 PM Lizabeth Riggs wrote: Medication: pantoprazole  (PROTONIX ) 20 MG tablet  Has the patient contacted their pharmacy? Yes (Agent: If no, request that the patient contact the pharmacy for the refill. If patient does not wish to contact the pharmacy document the reason why and proceed with request.) (Agent: If yes, when and what did the pharmacy advise?) Pharmacy needs order to refill  This is the patient's preferred pharmacy:  [name] [place] - [address] [place] [address] Phone: [phone] Fax: [phone]  Is this the correct pharmacy for this prescription? Yes If no, delete pharmacy and type the correct one.   Has the prescription been filled recently? No  Is the patient out of the medication? No He has 3-4 days left to take  Has the patient been seen for an appointment in the last year OR does the patient have an upcoming appointment? Yes  Can we respond through MyChart? Yes  Agent: Please be advised that Rx refills may take up to 3 business days. We ask that you follow-up with your pharmacy.

## 2024-04-27 ENCOUNTER — Ambulatory Visit: Admitting: Family

## 2024-04-27 ENCOUNTER — Encounter: Payer: Self-pay | Admitting: Family

## 2024-04-27 VITALS — BP 123/87 | HR 105 | Temp 98.3°F | Ht 69.0 in | Wt 184.6 lb

## 2024-04-27 DIAGNOSIS — R350 Frequency of micturition: Secondary | ICD-10-CM | POA: Diagnosis not present

## 2024-04-27 DIAGNOSIS — Z113 Encounter for screening for infections with a predominantly sexual mode of transmission: Secondary | ICD-10-CM

## 2024-04-27 DIAGNOSIS — K2100 Gastro-esophageal reflux disease with esophagitis, without bleeding: Secondary | ICD-10-CM | POA: Diagnosis not present

## 2024-04-27 LAB — URINALYSIS, COMPLETE
Bilirubin, UA: NEGATIVE
Glucose, UA: NEGATIVE
Ketones, UA: NEGATIVE
Leukocytes,UA: NEGATIVE
Nitrite, UA: NEGATIVE
RBC, UA: NEGATIVE
Specific Gravity, UA: 1.02 (ref 1.005–1.030)
Urobilinogen, Ur: 1 mg/dL (ref 0.2–1.0)
pH, UA: 7 (ref 5.0–7.5)

## 2024-04-27 LAB — MICROSCOPIC EXAMINATION
Bacteria, UA: NONE SEEN
Epithelial Cells (non renal): NONE SEEN /HPF (ref 0–10)
RBC, Urine: NONE SEEN /HPF (ref 0–2)
Renal Epithel, UA: NONE SEEN /HPF
WBC, UA: NONE SEEN /HPF (ref 0–5)
Yeast, UA: NONE SEEN

## 2024-04-27 MED ORDER — PANTOPRAZOLE SODIUM 20 MG PO TBEC: 20.0000 mg | DELAYED_RELEASE_TABLET | Freq: Two times a day (BID) | ORAL | 0 refills | Status: AC

## 2024-04-27 NOTE — Patient Instructions (Signed)
 Preventing Sexually Transmitted Infections, Adult Sexually transmitted infections (STIs) are spread from person to person (are contagious). They are spread, or transmitted, during sex. The sex may be vaginal, anal, or oral. STIs can be passed during sexual contact with skin, genitals, mouth, or rectum. They may spread through body fluids, such as saliva, semen, blood, vaginal mucus, and urine. STIs are very common. They can happen in people of all ages. Some common STIs are: Herpes. Hepatitis B. Chlamydia. Gonorrhea. Syphilis. Trichomoniasis. Human papillomavirus (HPV). Human immunodeficiency virus (HIV). This can cause acquired immunodeficiency syndrome (AIDS). How can STIs affect me? You may not have symptoms with an STI. Even if you do not have symptoms, you can still spread the infection to others. You also still need treatment. STIs can be treated. Some STIs can be cured. Other STIs cannot be cured and will affect you for the rest of your life. Certain STIs may: Require you to take medicine for the rest of your life. Affect your ability to have children. Increase your risk for getting other STIs. Increase your risk of getting certain conditions. These may include: Cervical cancer. Pelvic inflammatory disease (PID). Organ damage or damage to other parts of your body. This can happen if the infection spreads. Cause problems during pregnancy. STIs may be spread to the baby during pregnancy or birth. Females tend to have more severe problems from STIs than males. What can increase my risk? You may be more at risk for an STI if: You do not use protection during sex. You have more than one sex partner. You have a sex partner who has other sex partners. You have sex with a person who has an STI. You have an STI, or you have had an STI before. You inject drugs or have a sex partner who injects drugs. What actions can I take to prevent STIs? The only way to fully prevent STIs is not to  have sex of any kind. This is called practicing abstinence. If you are sexually active, you can protect yourself and others by taking these actions to lower your risk of getting an STI: Lifestyle Have only one sex partner or limit the number of sex partners you have. Avoid having sex after you have alcohol or drugs. Alcohol and drugs can affect your ability to make good choices. This can lead to risky sexual behaviors. Go to prevention counseling. This can teach you how to avoid getting an STI. Barrier protection  Use methods to stop body fluids from being exchanged between partners during sex (barrier protection). These methods can be used during oral, vaginal, or anal sex. They include: External condom, for males. Internal condom, for females. Dental dam. Use a new barrier method for every sex act from start to finish. Know that a barrier method may not protect you from all STIs. Some STIs, such as herpes, are spread through skin-to-skin contact. Avoid all sexual contact if you or a partner has herpes and there is an active flare with open sores. Birth control pills, injections, implants, and intrauterine devices (IUDs) do not protect against STIs. To prevent both STIs and pregnancy, always use a condom with a second form of birth control. General information Ask your health care provider about taking pre-exposure prophylaxis (PrEP) to prevent HIV. Stay up to date on your vaccines. Some vaccines can lower your risk of getting certain STIs. These include: Hepatitis B vaccine. HPV vaccine. This is recommended for people up to age 66. Get tested for STIs. Have your  partners get tested, too. If you test positive for an STI, follow recommendations from your health care provider about treatment. Make sure your sex partners are tested and treated as well. Where to find more information Learn more about STIs from: Centers for Disease Control and Prevention (CDC): More information about certain  STIs: TonerPromos.no Places to get sexual health counseling and treatment for free or at a low cost: gettested.TonerPromos.no U.S. Department of Health and Human Services [facility]): TravelLesson.ca This information is not intended to replace advice given to you by your health care provider. Make sure you discuss any questions you have with your health care provider. Document Revised: 06/10/2022 Document Reviewed: 11/13/2021 Elsevier Patient Education  2024 [employer].

## 2024-04-27 NOTE — Progress Notes (Signed)
 Subjective:    Patient ID: Marc Roy, male    DOB: 24-Oct-1985, 38 y.o.   MRN: [id number]  No chief complaint on file.  PT presents to the office today for STD screening. He reports he has urinary frequency.  Urinary Frequency  This is a new problem. The current episode started 1 to 4 weeks ago. The problem occurs intermittently. The problem has been waxing and waning. The pain is at a severity of 0/10. The pain is mild. There has been no fever. Associated symptoms include frequency, hesitancy and urgency. Pertinent negatives include no discharge, flank pain, hematuria, nausea or vomiting. He has tried increased fluids for the symptoms. The treatment provided mild relief.  Gastroesophageal Reflux He complains of belching and heartburn. He reports no nausea. This is a chronic problem. The current episode started more than 1 year ago. The problem occurs occasionally. The symptoms are aggravated by certain foods. He has tried a PPI for the symptoms. The treatment provided moderate relief.      Review of Systems  Gastrointestinal:  Positive for heartburn. Negative for nausea and vomiting.  Genitourinary:  Positive for frequency, hesitancy and urgency. Negative for flank pain and hematuria.  All other systems reviewed and are negative.   Social History   Socioeconomic History   Marital status: Single    Spouse name: Not on file   Number of children: Not on file   Years of education: Not on file   Highest education level: Not on file  Occupational History   Not on file  Tobacco Use   Smoking status: Former    Current packs/day: 0.00    Types: Cigarettes    Quit date: 2012    Years since quitting: 13.8   Smokeless tobacco: Never  Vaping Use   Vaping status: Never Used  Substance and Sexual Activity   Alcohol use: Yes    Alcohol/week: 21.0 standard drinks of alcohol    Types: 21 Cans of beer per week    Comment: 09/18/20 very little   Drug use: Yes    Types: Marijuana    Sexual activity: Yes    Birth control/protection: None  Other Topics Concern   Not on file  Social History Narrative   Not on file   Social Drivers of Health   Financial Resource Strain: Not on file  Food Insecurity: No Food Insecurity (01/06/2023)   Hunger Vital Sign    Worried About Running Out of Food in the Last Year: Never true    Ran Out of Food in the Last Year: Never true  Transportation Needs: No Transportation Needs (01/06/2023)   PRAPARE - Administrator, Civil Service (Medical): No    Lack of Transportation (Non-Medical): No  Physical Activity: Not on file  Stress: Not on file  Social Connections: Unknown (10/27/2021)   [facility]   Social Network    Social Network: Not on file   Family History  Problem Relation Age of Onset   Alzheimer's disease Maternal Grandmother    Alzheimer's disease Paternal Grandmother    Sleep apnea Neg Hx         Objective:   Physical Exam Vitals reviewed.  Constitutional:      General: He is not in acute distress.    Appearance: He is well-developed.  HENT:     Head: Normocephalic.     Right Ear: Tympanic membrane normal.     Left Ear: Tympanic membrane normal.  Eyes:  General:        Right eye: No discharge.        Left eye: No discharge.     Pupils: Pupils are equal, round, and reactive to light.  Neck:     Thyroid : No thyromegaly.  Cardiovascular:     Rate and Rhythm: Normal rate and regular rhythm.     Heart sounds: Normal heart sounds. No murmur heard. Pulmonary:     Effort: Pulmonary effort is normal. No respiratory distress.     Breath sounds: Normal breath sounds. No wheezing.  Abdominal:     General: Bowel sounds are normal. There is no distension.     Palpations: Abdomen is soft.     Tenderness: There is no abdominal tenderness.  Musculoskeletal:        General: No tenderness. Normal range of motion.     Cervical back: Normal range of motion and neck supple.  Skin:     General: Skin is warm and dry.     Findings: No erythema or rash.  Neurological:     Mental Status: He is alert and oriented to person, place, and time.     Cranial Nerves: No cranial nerve deficit.     Deep Tendon Reflexes: Reflexes are normal and symmetric.  Psychiatric:        Behavior: Behavior normal.        Thought Content: Thought content normal.        Judgment: Judgment normal.       BP 123/87   Pulse (!) 105   Temp 98.3 F (36.8 C) (Temporal)   Ht 5' 9 (1.753 m)   Wt 184 lb 9.6 oz (83.7 kg)   BMI 27.26 kg/m      Assessment & Plan:  Marc Roy comes in today with chief complaint of No chief complaint on file.   Diagnosis and orders addressed:  1. Gastroesophageal reflux disease with esophagitis without hemorrhage -Diet discussed- Avoid fried, spicy, citrus foods, caffeine and alcohol -Do not eat 2-3 hours before bedtime -Encouraged small frequent meals -Avoid NSAID's - pantoprazole  (PROTONIX ) 20 MG tablet; Take 1 tablet (20 mg total) by mouth 2 (two) times daily.  Dispense: 180 tablet; Refill: 0  2. Urinary frequency (Primary) - Urinalysis, Complete  3. Screen for STD (sexually transmitted disease) Urine negative for UTI STD panel pending  Do not wish to do blood work today Safe sex - Urine cytology ancillary only    Marc Learn, FNP

## 2024-04-30 ENCOUNTER — Ambulatory Visit: Payer: Self-pay | Admitting: Family

## 2024-05-03 ENCOUNTER — Other Ambulatory Visit: Payer: Self-pay | Admitting: Family

## 2024-05-03 ENCOUNTER — Other Ambulatory Visit

## 2024-05-03 DIAGNOSIS — K2100 Gastro-esophageal reflux disease with esophagitis, without bleeding: Secondary | ICD-10-CM

## 2024-05-04 ENCOUNTER — Other Ambulatory Visit

## 2024-05-04 DIAGNOSIS — Z113 Encounter for screening for infections with a predominantly sexual mode of transmission: Secondary | ICD-10-CM | POA: Diagnosis not present

## 2024-05-06 LAB — CT NG M GENITALIUM NAA, URINE: Mycoplasma genitalium NAA: NEGATIVE
